# Patient Record
Sex: Female | Born: 1980
Health system: Southern US, Community
[De-identification: ages and names within clinical notes are randomized; demographics above are authoritative.]

## PROBLEM LIST (undated history)

## (undated) ENCOUNTER — Inpatient Hospital Stay (HOSPITAL_COMMUNITY): Payer: Federal, State, Local not specified - PPO

## (undated) DIAGNOSIS — R87629 Unspecified abnormal cytological findings in specimens from vagina: Secondary | ICD-10-CM

## (undated) DIAGNOSIS — O24419 Gestational diabetes mellitus in pregnancy, unspecified control: Secondary | ICD-10-CM

## (undated) HISTORY — PX: WISDOM TOOTH EXTRACTION: SHX21

## (undated) HISTORY — PX: CERVICAL BIOPSY  W/ LOOP ELECTRODE EXCISION: SUR135

---

## 2011-12-31 LAB — OB RESULTS CONSOLE GC/CHLAMYDIA
Chlamydia: NEGATIVE
Gonorrhea: NEGATIVE

## 2012-03-15 LAB — OB RESULTS CONSOLE RUBELLA ANTIBODY, IGM: Rubella: IMMUNE

## 2012-03-15 LAB — OB RESULTS CONSOLE ABO/RH: RH Type: POSITIVE

## 2012-03-15 LAB — OB RESULTS CONSOLE ANTIBODY SCREEN: Antibody Screen: NEGATIVE

## 2012-05-26 ENCOUNTER — Encounter: Payer: 59 | Attending: Obstetrics and Gynecology | Admitting: *Deleted

## 2012-05-26 ENCOUNTER — Encounter: Payer: Self-pay | Admitting: *Deleted

## 2012-05-26 DIAGNOSIS — Z713 Dietary counseling and surveillance: Secondary | ICD-10-CM | POA: Insufficient documentation

## 2012-05-26 DIAGNOSIS — O99810 Abnormal glucose complicating pregnancy: Secondary | ICD-10-CM | POA: Insufficient documentation

## 2012-05-26 NOTE — Progress Notes (Signed)
  Patient was seen on 05/26/2012 for Gestational Diabetes self-management class at the [facility]. The following learning objectives were met by the patient during this course:   States the definition of Gestational Diabetes  States why dietary management is important in controlling blood glucose  Describes the effects each nutrient has on blood glucose levels  Demonstrates ability to create a balanced meal plan  Demonstrates carbohydrate counting   States when to check blood glucose levels  Demonstrates proper blood glucose monitoring techniques  States the effect of stress and exercise on blood glucose levels  States the importance of limiting caffeine and abstaining from alcohol and smoking  Blood glucose monitor given: Blood glucose monitor given: [employer] EZ Meter Kit Lot # [id number] Exp: 11/2013 Blood glucose reading: 161 mg/dl *Pt states she has MD appointment this afternoon, so I do not plan to call office with this result.  Patient instructed to monitor glucose levels: FBS: 60 - <90 2 hour: <120  *Patient received handouts:  Nutrition Diabetes and Pregnancy  Carbohydrate Counting List  Patient will be seen for follow-up as needed.

## 2012-05-26 NOTE — Patient Instructions (Signed)
Goals:  Check glucose levels per MD as instructed  Follow Gestational Diabetes Diet as instructed  Call for follow-up as needed    

## 2012-07-21 ENCOUNTER — Other Ambulatory Visit: Payer: Self-pay | Admitting: Obstetrics and Gynecology

## 2012-07-25 ENCOUNTER — Inpatient Hospital Stay (HOSPITAL_COMMUNITY)
Admission: AD | Admit: 2012-07-25 | Discharge: 2012-07-29 | DRG: 765 | Disposition: A | Discharge status: Home / Self Care | Payer: 59 | Source: Ambulatory Visit | Attending: Obstetrics and Gynecology | Admitting: Obstetrics and Gynecology

## 2012-07-25 ENCOUNTER — Encounter (HOSPITAL_COMMUNITY): Payer: Self-pay | Admitting: *Deleted

## 2012-07-25 ENCOUNTER — Encounter (HOSPITAL_COMMUNITY): Payer: Self-pay | Admitting: Anesthesiology

## 2012-07-25 DIAGNOSIS — O9902 Anemia complicating childbirth: Secondary | ICD-10-CM | POA: Diagnosis not present

## 2012-07-25 DIAGNOSIS — D62 Acute posthemorrhagic anemia: Secondary | ICD-10-CM | POA: Diagnosis not present

## 2012-07-25 DIAGNOSIS — O323XX0 Maternal care for face, brow and chin presentation, not applicable or unspecified: Principal | ICD-10-CM | POA: Diagnosis present

## 2012-07-25 DIAGNOSIS — O99892 Other specified diseases and conditions complicating childbirth: Secondary | ICD-10-CM | POA: Diagnosis present

## 2012-07-25 DIAGNOSIS — Z22330 Carrier of Group B streptococcus: Secondary | ICD-10-CM

## 2012-07-25 DIAGNOSIS — O9903 Anemia complicating the puerperium: Secondary | ICD-10-CM | POA: Diagnosis not present

## 2012-07-25 DIAGNOSIS — O99814 Abnormal glucose complicating childbirth: Secondary | ICD-10-CM | POA: Diagnosis present

## 2012-07-25 HISTORY — DX: Gestational diabetes mellitus in pregnancy, unspecified control: O24.419

## 2012-07-25 LAB — CBC
Hemoglobin: 12.7 g/dL (ref 12.0–15.0)
MCH: 29.4 pg (ref 26.0–34.0)
MCHC: 34.2 g/dL (ref 30.0–36.0)
MCV: 85.9 fL (ref 78.0–100.0)

## 2012-07-25 LAB — RPR: RPR Ser Ql: NONREACTIVE

## 2012-07-25 MED ORDER — OXYTOCIN 40 UNITS IN LACTATED RINGERS INFUSION - SIMPLE MED
1.0000 m[IU]/min | INTRAVENOUS | Status: DC
  Administered 2012-07-25: 2 m[IU]/min via INTRAVENOUS
  Filled 2012-07-25: qty 1000

## 2012-07-25 MED ORDER — EPHEDRINE 5 MG/ML INJ
10.0000 mg | INTRAVENOUS | Status: DC | PRN
  Filled 2012-07-25: qty 4

## 2012-07-25 MED ORDER — MISOPROSTOL 25 MCG QUARTER TABLET: 25.0000 ug | ORAL_TABLET | ORAL | Status: DC | PRN

## 2012-07-25 MED ORDER — TERBUTALINE SULFATE 1 MG/ML IJ SOLN: 0.2500 mg | Freq: Once | INTRAMUSCULAR | Status: AC | PRN

## 2012-07-25 MED ORDER — LACTATED RINGERS IV SOLN: 500.0000 mL | INTRAVENOUS | Status: DC | PRN

## 2012-07-25 MED ORDER — ACETAMINOPHEN 325 MG PO TABS: 650.0000 mg | ORAL_TABLET | ORAL | Status: DC | PRN

## 2012-07-25 MED ORDER — LACTATED RINGERS IV SOLN
500.0000 mL | Freq: Once | INTRAVENOUS | Status: AC
  Administered 2012-07-25: 500 mL via INTRAVENOUS

## 2012-07-25 MED ORDER — CLINDAMYCIN PHOSPHATE 900 MG/50ML IV SOLN
900.0000 mg | Freq: Three times a day (TID) | INTRAVENOUS | Status: DC
  Administered 2012-07-25 (×2): 900 mg via INTRAVENOUS
  Filled 2012-07-25 (×4): qty 50

## 2012-07-25 MED ORDER — NALBUPHINE SYRINGE 5 MG/0.5 ML
5.0000 mg | INJECTION | INTRAMUSCULAR | Status: DC | PRN
  Administered 2012-07-25: 5 mg via INTRAVENOUS
  Filled 2012-07-25: qty 0.5

## 2012-07-25 MED ORDER — LIDOCAINE HCL (PF) 1 % IJ SOLN
INTRAMUSCULAR | Status: DC | PRN
  Administered 2012-07-25: 7 mL
  Administered 2012-07-25: 9 mL

## 2012-07-25 MED ORDER — OXYTOCIN 40 UNITS IN LACTATED RINGERS INFUSION - SIMPLE MED: 62.5000 mL/h | INTRAVENOUS | Status: DC

## 2012-07-25 MED ORDER — CLINDAMYCIN PHOSPHATE 900 MG/50ML IV SOLN: 900.0000 mg | Freq: Three times a day (TID) | INTRAVENOUS | Status: DC

## 2012-07-25 MED ORDER — FENTANYL 2.5 MCG/ML BUPIVACAINE 1/10 % EPIDURAL INFUSION (WH - ANES)
14.0000 mL/h | INTRAMUSCULAR | Status: DC
  Filled 2012-07-25: qty 125

## 2012-07-25 MED ORDER — CITRIC ACID-SODIUM CITRATE 334-500 MG/5ML PO SOLN
30.0000 mL | ORAL | Status: DC | PRN
  Administered 2012-07-26: 30 mL via ORAL
  Filled 2012-07-25 (×2): qty 15

## 2012-07-25 MED ORDER — LACTATED RINGERS IV SOLN
INTRAVENOUS | Status: DC
  Administered 2012-07-25: 16:00:00 via INTRAVENOUS

## 2012-07-25 MED ORDER — LIDOCAINE HCL (PF) 1 % IJ SOLN: 30.0000 mL | INTRAMUSCULAR | Status: DC | PRN

## 2012-07-25 MED ORDER — DIPHENHYDRAMINE HCL 50 MG/ML IJ SOLN: 12.5000 mg | INTRAMUSCULAR | Status: DC | PRN

## 2012-07-25 MED ORDER — ONDANSETRON HCL 4 MG/2ML IJ SOLN
4.0000 mg | Freq: Four times a day (QID) | INTRAMUSCULAR | Status: DC | PRN
  Administered 2012-07-25: 4 mg via INTRAVENOUS
  Filled 2012-07-25: qty 2

## 2012-07-25 MED ORDER — IBUPROFEN 600 MG PO TABS: 600.0000 mg | ORAL_TABLET | Freq: Four times a day (QID) | ORAL | Status: DC | PRN

## 2012-07-25 MED ORDER — PHENYLEPHRINE 40 MCG/ML (10ML) SYRINGE FOR IV PUSH (FOR BLOOD PRESSURE SUPPORT)
80.0000 ug | PREFILLED_SYRINGE | INTRAVENOUS | Status: DC | PRN
  Filled 2012-07-25: qty 5

## 2012-07-25 MED ORDER — ZOLPIDEM TARTRATE 5 MG PO TABS: 5.0000 mg | ORAL_TABLET | Freq: Every evening | ORAL | Status: DC | PRN

## 2012-07-25 MED ORDER — FENTANYL 2.5 MCG/ML BUPIVACAINE 1/10 % EPIDURAL INFUSION (WH - ANES)
INTRAMUSCULAR | Status: DC | PRN
  Administered 2012-07-25: 14 mL/h via EPIDURAL

## 2012-07-25 MED ORDER — EPHEDRINE 5 MG/ML INJ: 10.0000 mg | INTRAVENOUS | Status: DC | PRN

## 2012-07-25 MED ORDER — OXYTOCIN BOLUS FROM INFUSION: 500.0000 mL | INTRAVENOUS | Status: DC

## 2012-07-25 MED ORDER — PHENYLEPHRINE 40 MCG/ML (10ML) SYRINGE FOR IV PUSH (FOR BLOOD PRESSURE SUPPORT): 80.0000 ug | PREFILLED_SYRINGE | INTRAVENOUS | Status: DC | PRN

## 2012-07-25 MED ORDER — OXYCODONE-ACETAMINOPHEN 5-325 MG PO TABS: 1.0000 | ORAL_TABLET | ORAL | Status: DC | PRN

## 2012-07-25 MED ORDER — FLEET ENEMA 7-19 GM/118ML RE ENEM: 1.0000 | ENEMA | RECTAL | Status: DC | PRN

## 2012-07-25 NOTE — MAU Note (Signed)
SVE in office last week, 1 cm.

## 2012-07-25 NOTE — Plan of Care (Signed)
Problem: Consults Goal: Birthing Suites Patient Information Press F2 to bring up selections list   Pt 37-[redacted] weeks EGA     

## 2012-07-25 NOTE — H&P (Signed)
[name] [name] is a 31 y.o. female presenting @ 29 6/7 weeks in labor. PNC complicated by Class A1 GDM diet controlled and (+) GBS. Intact membrane History OB History    Grav Para Term Preterm Abortions TAB SAB Ect Mult Living   1 0 0 0 0 0 0 0 0 0      Past Medical History  Diagnosis Date  . Gestational diabetes    Past Surgical History  Procedure Date  . Cervical biopsy  w/ loop electrode excision   . Wisdom tooth extraction    Family History: family history is not on file. Social History:  reports that she has never smoked. She does not have any smokeless tobacco history on file. She reports that she does not drink alcohol or use illicit drugs.   Prenatal Transfer Tool  Maternal Diabetes: Yes:  Diabetes Type:  Diet controlled Genetic Screening: Normal Maternal Ultrasounds/Referrals: Normal Fetal Ultrasounds or other Referrals:  None Maternal Substance Abuse:  No Significant Maternal Medications:  None Significant Maternal Lab Results:  Lab values include: Group B Strep positive Other Comments:  None  ROS neg  Dilation: 2 Effacement (%): 100 Station: -1 Exam by:: Dorrene German RN Blood pressure 128/76, pulse 63, temperature 97 F (36.1 C), temperature source Axillary, resp. rate 16, height 5\' 5"  (1.651 m), weight 85.73 kg (189 lb). Maternal Exam:  Uterine Assessment: Contraction strength is mild.  Abdomen: Patient reports no abdominal tenderness. Estimated fetal weight is  7  1/2 lb.   Fetal presentation: vertex  Introitus: Normal vulva. Ferning test: not done.  Nitrazine test: not done.  Pelvis: adequate for delivery.     VE 2/100/-3 palp membranes cervix deviated to left Physical Exam  Constitutional: She is oriented to person, place, and time. She appears well-developed and well-nourished.  HENT:  Head: Atraumatic.  Neck: Neck supple.  Respiratory: Effort normal.  Musculoskeletal: She exhibits no edema.  Neurological: She is alert and oriented to person, place, and  time.  Skin: Skin is warm and dry.    Prenatal labs: ABO, Rh: B/Positive/-- (07/22 0000) Antibody: Negative (07/22 0000) Rubella: Immune (07/22 0000) RPR: Nonreactive (07/22 0000)  HBsAg: Negative (07/22 0000)  HIV: Non-reactive (07/22 0000)  GBS: Positive (11/12 0000)   Assessment/Plan: Early labor Class A1 GDM Term gestation GBS cx (+) P) admit. Left exaggerated sims position. Routine labs. BS q 4 hrs. Clindamycin IV q 8 hrs pitocin analgesic prn   [name] [name] A 07/25/2012, 4:24 PM

## 2012-07-25 NOTE — Progress Notes (Signed)
S: breathing with ctx  O: pitocin 2 MIU VE 4-5/90/-3 hyperextended/early face presentation left mentum anterior  Arom light meconium IUPC placed  Tracing: baseline 125 (+) accel Ctx q 2-3 mins BS =71 IMP: Class A1 GDm diet controlled GBS cx (+) on Clindamycin Term Active phase w/ potential face presentation P) exaggerated right sims. Pt informed may not be deliverable vaginally. Recommend epidural . Will try to do maternal position to rotate baby

## 2012-07-25 NOTE — MAU Note (Signed)
uc's started during the night, became more frequent this morning, bloody show, denies LOF.

## 2012-07-25 NOTE — Anesthesia Preprocedure Evaluation (Addendum)
Anesthesia Evaluation  Patient identified by MRN, date of birth, ID band Patient awake    Reviewed: Allergy & Precautions, H&P , NPO status , Patient's Chart, lab work & pertinent test results  Airway Mallampati: II TM Distance: >3 FB Neck ROM: full    Dental No notable dental hx.    Pulmonary neg pulmonary ROS,    Pulmonary exam normal       Cardiovascular negative cardio ROS      Neuro/Psych negative neurological ROS  negative psych ROS   GI/Hepatic negative GI ROS, Neg liver ROS,   Endo/Other  diabetes, Gestational  Renal/GU negative Renal ROS  negative genitourinary   Musculoskeletal negative musculoskeletal ROS (+)   Abdominal Normal abdominal exam  (+)   Peds negative pediatric ROS (+)  Hematology negative hematology ROS (+)   Anesthesia Other Findings   Reproductive/Obstetrics (+) Pregnancy                           Anesthesia Physical Anesthesia Plan  ASA: II  Anesthesia Plan: Epidural   Post-op Pain Management:    Induction:   Airway Management Planned:   Additional Equipment:   Intra-op Plan:   Post-operative Plan:   Informed Consent: I have reviewed the patients History and Physical, chart, labs and discussed the procedure including the risks, benefits and alternatives for the proposed anesthesia with the patient or authorized representative who has indicated his/her understanding and acceptance.     Plan Discussed with:   Anesthesia Plan Comments:         Anesthesia Quick Evaluation

## 2012-07-25 NOTE — Anesthesia Procedure Notes (Signed)
Epidural Patient location during procedure: OB Start time: 07/25/2012 9:59 PM End time: 07/25/2012 10:05 PM  Staffing Anesthesiologist: Sandrea Hughs  Preanesthetic Checklist Completed: patient identified, site marked, surgical consent, pre-op evaluation, timeout performed, IV checked, risks and benefits discussed and monitors and equipment checked  Epidural Patient position: sitting Prep: site prepped and draped and DuraPrep Patient monitoring: continuous pulse ox and blood pressure Approach: midline Injection technique: LOR air  Needle:  Needle type: Tuohy  Needle gauge: 17 G Needle length: 9 cm and 9 Needle insertion depth: 6 cm Catheter type: closed end flexible Catheter size: 19 Gauge Catheter at skin depth: 11 cm Test dose: negative and Other  Assessment Sensory level: T9 Events: blood not aspirated, injection not painful, no injection resistance, negative IV test and no paresthesia  Additional Notes Reason for block:procedure for pain

## 2012-07-26 ENCOUNTER — Inpatient Hospital Stay (HOSPITAL_COMMUNITY): Payer: 59 | Admitting: Anesthesiology

## 2012-07-26 ENCOUNTER — Encounter (HOSPITAL_COMMUNITY)
Admission: AD | Disposition: A | Discharge status: Home / Self Care | Payer: Self-pay | Source: Ambulatory Visit | Attending: Obstetrics and Gynecology

## 2012-07-26 ENCOUNTER — Encounter (HOSPITAL_COMMUNITY): Payer: Self-pay | Admitting: Anesthesiology

## 2012-07-26 ENCOUNTER — Encounter (HOSPITAL_COMMUNITY): Payer: Self-pay | Admitting: *Deleted

## 2012-07-26 LAB — GLUCOSE, CAPILLARY: Glucose-Capillary: 96 mg/dL (ref 70–99)

## 2012-07-26 SURGERY — Surgical Case
Anesthesia: Epidural | Site: Abdomen | Wound class: Clean Contaminated

## 2012-07-26 MED ORDER — OXYTOCIN 10 UNIT/ML IJ SOLN
INTRAMUSCULAR | Status: AC
  Filled 2012-07-26: qty 4

## 2012-07-26 MED ORDER — SENNOSIDES-DOCUSATE SODIUM 8.6-50 MG PO TABS
2.0000 | ORAL_TABLET | Freq: Every day | ORAL | Status: DC
  Administered 2012-07-26 – 2012-07-28 (×3): 2 via ORAL

## 2012-07-26 MED ORDER — LANOLIN HYDROUS EX OINT: 1.0000 "application " | TOPICAL_OINTMENT | CUTANEOUS | Status: DC | PRN

## 2012-07-26 MED ORDER — WITCH HAZEL-GLYCERIN EX PADS: 1.0000 "application " | MEDICATED_PAD | CUTANEOUS | Status: DC | PRN

## 2012-07-26 MED ORDER — NALOXONE HCL 1 MG/ML IJ SOLN
1.0000 ug/kg/h | INTRAVENOUS | Status: DC | PRN
  Filled 2012-07-26: qty 2

## 2012-07-26 MED ORDER — CEFAZOLIN SODIUM-DEXTROSE 2-3 GM-% IV SOLR
INTRAVENOUS | Status: AC
  Filled 2012-07-26: qty 50

## 2012-07-26 MED ORDER — KETOROLAC TROMETHAMINE 30 MG/ML IJ SOLN: 30.0000 mg | Freq: Four times a day (QID) | INTRAMUSCULAR | Status: AC | PRN

## 2012-07-26 MED ORDER — LACTATED RINGERS IV SOLN
INTRAVENOUS | Status: DC
  Administered 2012-07-26: 08:00:00 via INTRAVENOUS

## 2012-07-26 MED ORDER — ONDANSETRON HCL 4 MG/2ML IJ SOLN: 4.0000 mg | Freq: Three times a day (TID) | INTRAMUSCULAR | Status: DC | PRN

## 2012-07-26 MED ORDER — 0.9 % SODIUM CHLORIDE (POUR BTL) OPTIME
TOPICAL | Status: DC | PRN
  Administered 2012-07-26: 1000 mL

## 2012-07-26 MED ORDER — MEPERIDINE HCL 25 MG/ML IJ SOLN: 6.2500 mg | INTRAMUSCULAR | Status: DC | PRN

## 2012-07-26 MED ORDER — ONDANSETRON HCL 4 MG/2ML IJ SOLN: 4.0000 mg | INTRAMUSCULAR | Status: DC | PRN

## 2012-07-26 MED ORDER — FENTANYL CITRATE 0.05 MG/ML IJ SOLN
INTRAMUSCULAR | Status: DC | PRN
  Administered 2012-07-26 (×2): 50 ug via INTRAVENOUS

## 2012-07-26 MED ORDER — OXYTOCIN 40 UNITS IN LACTATED RINGERS INFUSION - SIMPLE MED: 62.5000 mL/h | INTRAVENOUS | Status: AC

## 2012-07-26 MED ORDER — HYDROMORPHONE HCL PF 1 MG/ML IJ SOLN: 0.2500 mg | INTRAMUSCULAR | Status: DC | PRN

## 2012-07-26 MED ORDER — SODIUM BICARBONATE 8.4 % IV SOLN
INTRAVENOUS | Status: DC | PRN
  Administered 2012-07-26: 10 mL via EPIDURAL

## 2012-07-26 MED ORDER — KETOROLAC TROMETHAMINE 60 MG/2ML IM SOLN
60.0000 mg | Freq: Once | INTRAMUSCULAR | Status: AC | PRN
  Administered 2012-07-26: 60 mg via INTRAMUSCULAR

## 2012-07-26 MED ORDER — EPHEDRINE 5 MG/ML INJ
INTRAVENOUS | Status: AC
  Filled 2012-07-26: qty 10

## 2012-07-26 MED ORDER — ONDANSETRON HCL 4 MG PO TABS: 4.0000 mg | ORAL_TABLET | ORAL | Status: DC | PRN

## 2012-07-26 MED ORDER — SIMETHICONE 80 MG PO CHEW: 80.0000 mg | CHEWABLE_TABLET | ORAL | Status: DC | PRN

## 2012-07-26 MED ORDER — NALBUPHINE SYRINGE 5 MG/0.5 ML
5.0000 mg | INJECTION | INTRAMUSCULAR | Status: DC | PRN
  Filled 2012-07-26: qty 1

## 2012-07-26 MED ORDER — MORPHINE SULFATE (PF) 0.5 MG/ML IJ SOLN
INTRAMUSCULAR | Status: DC | PRN
  Administered 2012-07-26: 1 mg via INTRAVENOUS

## 2012-07-26 MED ORDER — OXYTOCIN 10 UNIT/ML IJ SOLN
40.0000 [IU] | INTRAVENOUS | Status: DC | PRN
  Administered 2012-07-26: 40 [IU] via INTRAVENOUS

## 2012-07-26 MED ORDER — DIPHENHYDRAMINE HCL 25 MG PO CAPS: 25.0000 mg | ORAL_CAPSULE | ORAL | Status: DC | PRN

## 2012-07-26 MED ORDER — DIPHENHYDRAMINE HCL 50 MG/ML IJ SOLN: 12.5000 mg | INTRAMUSCULAR | Status: DC | PRN

## 2012-07-26 MED ORDER — BUPIVACAINE HCL (PF) 0.25 % IJ SOLN
INTRAMUSCULAR | Status: DC | PRN
  Administered 2012-07-26: 6 mL

## 2012-07-26 MED ORDER — PHENYLEPHRINE HCL 10 MG/ML IJ SOLN
INTRAMUSCULAR | Status: DC | PRN
  Administered 2012-07-26: 80 ug via INTRAVENOUS
  Administered 2012-07-26 (×2): 120 ug via INTRAVENOUS
  Administered 2012-07-26 (×2): 80 ug via INTRAVENOUS
  Administered 2012-07-26: 120 ug via INTRAVENOUS

## 2012-07-26 MED ORDER — OXYCODONE-ACETAMINOPHEN 5-325 MG PO TABS
1.0000 | ORAL_TABLET | ORAL | Status: DC | PRN
  Administered 2012-07-27 – 2012-07-29 (×9): 1 via ORAL
  Filled 2012-07-26 (×9): qty 1

## 2012-07-26 MED ORDER — SODIUM CHLORIDE 0.9 % IJ SOLN: 3.0000 mL | INTRAMUSCULAR | Status: DC | PRN

## 2012-07-26 MED ORDER — FENTANYL CITRATE 0.05 MG/ML IJ SOLN
INTRAMUSCULAR | Status: AC
  Filled 2012-07-26: qty 2

## 2012-07-26 MED ORDER — PHENYLEPHRINE 40 MCG/ML (10ML) SYRINGE FOR IV PUSH (FOR BLOOD PRESSURE SUPPORT)
PREFILLED_SYRINGE | INTRAVENOUS | Status: AC
  Filled 2012-07-26: qty 10

## 2012-07-26 MED ORDER — KETOROLAC TROMETHAMINE 60 MG/2ML IM SOLN
INTRAMUSCULAR | Status: AC
  Administered 2012-07-26: 60 mg via INTRAMUSCULAR
  Filled 2012-07-26: qty 2

## 2012-07-26 MED ORDER — ONDANSETRON HCL 4 MG/2ML IJ SOLN
INTRAMUSCULAR | Status: DC | PRN
  Administered 2012-07-26: 4 mg via INTRAVENOUS

## 2012-07-26 MED ORDER — GENTAMICIN SULFATE 40 MG/ML IJ SOLN
128.5500 mg | INTRAVENOUS | Status: DC | PRN
  Administered 2012-07-26: 350 mg via INTRAVENOUS

## 2012-07-26 MED ORDER — KETOROLAC TROMETHAMINE 30 MG/ML IJ SOLN: 15.0000 mg | Freq: Once | INTRAMUSCULAR | Status: DC | PRN

## 2012-07-26 MED ORDER — DEXTROSE 5 % IV SOLN
350.0000 mg | Freq: Once | INTRAVENOUS | Status: DC
  Filled 2012-07-26: qty 8.75

## 2012-07-26 MED ORDER — NALOXONE HCL 0.4 MG/ML IJ SOLN: 0.4000 mg | INTRAMUSCULAR | Status: DC | PRN

## 2012-07-26 MED ORDER — SIMETHICONE 80 MG PO CHEW
80.0000 mg | CHEWABLE_TABLET | Freq: Three times a day (TID) | ORAL | Status: DC
  Administered 2012-07-26 – 2012-07-29 (×10): 80 mg via ORAL

## 2012-07-26 MED ORDER — DIPHENHYDRAMINE HCL 50 MG/ML IJ SOLN: 25.0000 mg | INTRAMUSCULAR | Status: DC | PRN

## 2012-07-26 MED ORDER — PRENATAL MULTIVITAMIN CH
1.0000 | ORAL_TABLET | Freq: Every day | ORAL | Status: DC
  Administered 2012-07-27 – 2012-07-29 (×3): 1 via ORAL
  Filled 2012-07-26 (×3): qty 1

## 2012-07-26 MED ORDER — LACTATED RINGERS IV SOLN
INTRAVENOUS | Status: DC | PRN
  Administered 2012-07-26 (×2): via INTRAVENOUS

## 2012-07-26 MED ORDER — MORPHINE SULFATE (PF) 0.5 MG/ML IJ SOLN
INTRAMUSCULAR | Status: DC | PRN
  Administered 2012-07-26: 4 mg via EPIDURAL

## 2012-07-26 MED ORDER — DIBUCAINE 1 % RE OINT: 1.0000 "application " | TOPICAL_OINTMENT | RECTAL | Status: DC | PRN

## 2012-07-26 MED ORDER — BISACODYL 10 MG RE SUPP: 10.0000 mg | Freq: Every day | RECTAL | Status: DC | PRN

## 2012-07-26 MED ORDER — IBUPROFEN 600 MG PO TABS
600.0000 mg | ORAL_TABLET | Freq: Four times a day (QID) | ORAL | Status: DC
  Administered 2012-07-26 – 2012-07-29 (×11): 600 mg via ORAL
  Filled 2012-07-26 (×12): qty 1

## 2012-07-26 MED ORDER — METHYLERGONOVINE MALEATE 0.2 MG/ML IJ SOLN: 0.2000 mg | INTRAMUSCULAR | Status: DC | PRN

## 2012-07-26 MED ORDER — FLEET ENEMA 7-19 GM/118ML RE ENEM: 1.0000 | ENEMA | Freq: Every day | RECTAL | Status: DC | PRN

## 2012-07-26 MED ORDER — PHENYLEPHRINE 40 MCG/ML (10ML) SYRINGE FOR IV PUSH (FOR BLOOD PRESSURE SUPPORT)
PREFILLED_SYRINGE | INTRAVENOUS | Status: AC
  Filled 2012-07-26: qty 5

## 2012-07-26 MED ORDER — KETOROLAC TROMETHAMINE 30 MG/ML IJ SOLN
30.0000 mg | Freq: Four times a day (QID) | INTRAMUSCULAR | Status: AC | PRN
  Administered 2012-07-26: 30 mg via INTRAVENOUS
  Filled 2012-07-26: qty 1

## 2012-07-26 MED ORDER — EPHEDRINE SULFATE 50 MG/ML IJ SOLN
INTRAMUSCULAR | Status: DC | PRN
  Administered 2012-07-26 (×4): 5 mg via INTRAVENOUS

## 2012-07-26 MED ORDER — SCOPOLAMINE 1 MG/3DAYS TD PT72
MEDICATED_PATCH | TRANSDERMAL | Status: AC
  Administered 2012-07-26: 1.5 mg via TRANSDERMAL
  Filled 2012-07-26: qty 1

## 2012-07-26 MED ORDER — METHYLERGONOVINE MALEATE 0.2 MG PO TABS: 0.2000 mg | ORAL_TABLET | ORAL | Status: DC | PRN

## 2012-07-26 MED ORDER — BUPIVACAINE HCL (PF) 0.25 % IJ SOLN
INTRAMUSCULAR | Status: AC
  Filled 2012-07-26: qty 30

## 2012-07-26 MED ORDER — FERROUS SULFATE 325 (65 FE) MG PO TABS
325.0000 mg | ORAL_TABLET | Freq: Two times a day (BID) | ORAL | Status: DC
  Administered 2012-07-26 – 2012-07-27 (×2): 325 mg via ORAL
  Filled 2012-07-26 (×2): qty 1

## 2012-07-26 MED ORDER — ONDANSETRON HCL 4 MG/2ML IJ SOLN
INTRAMUSCULAR | Status: AC
  Filled 2012-07-26: qty 2

## 2012-07-26 MED ORDER — SCOPOLAMINE 1 MG/3DAYS TD PT72
1.0000 | MEDICATED_PATCH | Freq: Once | TRANSDERMAL | Status: DC
  Administered 2012-07-26: 1.5 mg via TRANSDERMAL

## 2012-07-26 MED ORDER — PROMETHAZINE HCL 25 MG/ML IJ SOLN: 6.2500 mg | INTRAMUSCULAR | Status: DC | PRN

## 2012-07-26 MED ORDER — DIPHENHYDRAMINE HCL 25 MG PO CAPS: 25.0000 mg | ORAL_CAPSULE | Freq: Four times a day (QID) | ORAL | Status: DC | PRN

## 2012-07-26 MED ORDER — SODIUM CHLORIDE 0.9 % IJ SOLN: 3.0000 mL | Freq: Two times a day (BID) | INTRAMUSCULAR | Status: DC

## 2012-07-26 MED ORDER — MENTHOL 3 MG MT LOZG: 1.0000 | LOZENGE | OROMUCOSAL | Status: DC | PRN

## 2012-07-26 MED ORDER — METOCLOPRAMIDE HCL 5 MG/ML IJ SOLN: 10.0000 mg | Freq: Three times a day (TID) | INTRAMUSCULAR | Status: DC | PRN

## 2012-07-26 MED ORDER — MORPHINE SULFATE 0.5 MG/ML IJ SOLN
INTRAMUSCULAR | Status: AC
  Filled 2012-07-26: qty 10

## 2012-07-26 MED ORDER — SODIUM CHLORIDE 0.9 % IV SOLN: 250.0000 mL | INTRAVENOUS | Status: DC

## 2012-07-26 MED ORDER — ZOLPIDEM TARTRATE 5 MG PO TABS: 5.0000 mg | ORAL_TABLET | Freq: Every evening | ORAL | Status: DC | PRN

## 2012-07-26 SURGICAL SUPPLY — 46 items
BARRIER ADHS 3X4 INTERCEED (GAUZE/BANDAGES/DRESSINGS) IMPLANT
BENZOIN TINCTURE PRP APPL 2/3 (GAUZE/BANDAGES/DRESSINGS) IMPLANT
CLOTH BEACON ORANGE TIMEOUT ST (SAFETY) ×2 IMPLANT
CONTAINER PREFILL 10% NBF 15ML (MISCELLANEOUS) IMPLANT
DRESSING TELFA 8X3 (GAUZE/BANDAGES/DRESSINGS) IMPLANT
DRSG COVADERM 4X10 (GAUZE/BANDAGES/DRESSINGS) ×4 IMPLANT
DURAPREP 26ML APPLICATOR (WOUND CARE) ×2 IMPLANT
ELECT REM PT RETURN 9FT ADLT (ELECTROSURGICAL) ×2
ELECTRODE REM PT RTRN 9FT ADLT (ELECTROSURGICAL) ×1 IMPLANT
EXTRACTOR VACUUM KIWI (MISCELLANEOUS) IMPLANT
EXTRACTOR VACUUM M CUP 4 TUBE (SUCTIONS) IMPLANT
GAUZE SPONGE 4X4 12PLY STRL LF (GAUZE/BANDAGES/DRESSINGS) ×4 IMPLANT
GLOVE BIO SURGEON STRL SZ 6.5 (GLOVE) IMPLANT
GLOVE BIOGEL PI IND STRL 7.0 (GLOVE) ×2 IMPLANT
GLOVE BIOGEL PI INDICATOR 7.0 (GLOVE) ×2
GLOVE SKINSENSE NS SZ6.5 (GLOVE) ×1
GLOVE SKINSENSE NS SZ7.0 (GLOVE) ×1
GLOVE SKINSENSE STRL SZ6.5 (GLOVE) ×1 IMPLANT
GLOVE SKINSENSE STRL SZ7.0 (GLOVE) ×1 IMPLANT
GOWN PREVENTION PLUS LG XLONG (DISPOSABLE) ×6 IMPLANT
KIT ABG SYR 3ML LUER SLIP (SYRINGE) IMPLANT
NEEDLE HYPO 25X1 1.5 SAFETY (NEEDLE) ×2 IMPLANT
NEEDLE HYPO 25X5/8 SAFETYGLIDE (NEEDLE) IMPLANT
NS IRRIG 1000ML POUR BTL (IV SOLUTION) ×2 IMPLANT
PACK C SECTION WH (CUSTOM PROCEDURE TRAY) ×2 IMPLANT
PAD ABD 7.5X8 STRL (GAUZE/BANDAGES/DRESSINGS) ×2 IMPLANT
PAD OB MATERNITY 4.3X12.25 (PERSONAL CARE ITEMS) ×2 IMPLANT
RTRCTR C-SECT PINK 25CM LRG (MISCELLANEOUS) IMPLANT
SLEEVE SCD COMPRESS KNEE MED (MISCELLANEOUS) ×2 IMPLANT
STAPLER VISISTAT 35W (STAPLE) ×2 IMPLANT
STRIP CLOSURE SKIN 1/2X4 (GAUZE/BANDAGES/DRESSINGS) IMPLANT
SUT CHROMIC GUT AB #0 18 (SUTURE) IMPLANT
SUT MNCRL 0 VIOLET CTX 36 (SUTURE) ×3 IMPLANT
SUT MON AB 4-0 PS1 27 (SUTURE) IMPLANT
SUT MONOCRYL 0 CTX 36 (SUTURE) ×3
SUT PLAIN 2 0 (SUTURE)
SUT PLAIN 2 0 XLH (SUTURE) ×2 IMPLANT
SUT PLAIN ABS 2-0 CT1 27XMFL (SUTURE) IMPLANT
SUT VIC AB 0 CT1 27 (SUTURE) ×2
SUT VIC AB 0 CT1 27XBRD ANBCTR (SUTURE) ×2 IMPLANT
SUT VIC AB 2-0 CT1 27 (SUTURE) ×1
SUT VIC AB 2-0 CT1 TAPERPNT 27 (SUTURE) ×1 IMPLANT
SYR CONTROL 10ML LL (SYRINGE) ×2 IMPLANT
TOWEL OR 17X24 6PK STRL BLUE (TOWEL DISPOSABLE) ×4 IMPLANT
TRAY FOLEY CATH 14FR (SET/KITS/TRAYS/PACK) IMPLANT
WATER STERILE IRR 1000ML POUR (IV SOLUTION) IMPLANT

## 2012-07-26 NOTE — Op Note (Signed)
NAME:  [name] [name], [name] [name]                ACCOUNT NO.:  [id number]  MEDICAL RECORD NO.:  [id number]  LOCATION:                                 FACILITY:  PHYSICIAN:  Maxie Better, M.D.DATE OF BIRTH:  [birth year]  DATE OF PROCEDURE:  07/26/2012 DATE OF DISCHARGE:                              OPERATIVE REPORT   PREOPERATIVE DIAGNOSES:  Persistent face presentation, class A1 gestational diabetes, term gestation.  PROCEDURES:  A primary cesarean section, Kerr hysterotomy.  POSTOPERATIVE DIAGNOSES:  Persistent face presentation, class A1 gestational diabetes, term gestation.  ANESTHESIA:  Epidural.  SURGEON:  Maxie Better, M.D.  ASSISTANT:  None.  PROCEDURE IN DETAIL:  Under adequate epidural anesthesia, the patient was placed in the supine position with a left lateral tilt.  An indwelling Foley catheter was already in place.  The patient was sterilely prepped and draped in usual fashion.  A 0.25% Marcaine was injected along the planned Pfannenstiel skin incision site. Pfannenstiel skin incision was then made, carried down to the rectus fascia.  Rectus fascia was opened transversely.  Rectus fascia was then bluntly and sharply dissected off the rectus muscle in the superior and inferior fashion.  The rectus muscle was split in midline.  The parietal peritoneum was opened bluntly and extended.  The vesicouterine peritoneum was opened transversely.  The bladder was then bluntly dissected off the lower uterine segment and displaced inferiorly with a bladder retractor.  A curvilinear low transverse uterine incision was then made and extended with bandage scissors.  Copious light meconium amniotic fluid was then noted.  Subsequent delivery of a live female in face presentation was accomplished.  Baby was bulb suctioned.  The abdomen cord was clamped, cut.  The baby was transferred to the awaiting pediatrician who assigned Apgars of 8 and 9 at one and five minutes. The placenta  was spontaneous intact.  Short cord noted but the placenta itself was not sent to pathology.  Uterine cavity was cleaned of debris. Uterine incision had no extension and was closed in two layers, the first layer with 0 Monocryl running locked stitch, second layer was imbricated using 0 Monocryl suture.  Good hemostasis was noted along the incision line.  Normal tubes and ovaries were noted bilaterally.  A small hematoma was noted on the left broad ligament area which was not expanding and therefore left alone.  The abdomen was then copiously irrigated and suctioned of debris.  The parietal peritoneum was then closed with 2-0 Vicryl.  The rectus fascia was closed with 0 Vicryl x2. The subcutaneous areas irrigated, small bleeders cauterized. Interrupted 2-0 plain sutures placed and the skin approximated using Ethicon staples.  SPECIMENS:  Placenta not sent to pathology.  ESTIMATED BLOOD LOSS:  700 mL.  INTRAOPERATIVE FLUID:  1700 mL.  URINE OUTPUT:  100 mL clear yellow urine.  COUNTS:  Sponge and instrument counts x2 was correct.  COMPLICATION:  None.  The patient tolerated the procedure well, was transferred to recovery in stable condition.     Maxie Better, M.D.     [place]/MEDQ  D:  07/26/2012  T:  07/26/2012  Job:  [id number]

## 2012-07-26 NOTE — Progress Notes (Signed)
POSTOPERATIVE DAY 0 - interval note (4-5 hourrs postop)   S:         Sleeping - family asked not to disturb - just fell asleep in past 1/2 hour              Reports some nausea - tolerating fluids ok              No complaints to family about pain - just exhausted  O:  VS: BP 109/70  Pulse 64  Temp 97.8 F (36.6 C) (Oral)  Resp 20  Ht 5\' 5"  (1.651 m)  Wt 85.73 kg (189 lb)  BMI 31.45 kg/m2  SpO2 98%  Breastfeeding? Unknown              Lochia reported by nursing light                        I&O:  -550  A:        POD #0 interval visit            P:        Routine postoperative care - rounding tomorrow as scheduled       Marlinda Mike CNM, MSN 07/26/2012, 8:50 AM

## 2012-07-26 NOTE — Transfer of Care (Signed)
Immediate Anesthesia Transfer of Care Note  Patient: [name] [name]  Procedure(s) Performed: Procedure(s) (LRB) with comments: CESAREAN SECTION (N/A) - Primary Cesarean Section Delivery Baby Girl @  [phone], Apgars 8/9   Patient Location: PACU  Anesthesia Type:Epidural  Level of Consciousness: awake, alert  and oriented  Airway & Oxygen Therapy: Patient Spontanous Breathing  Post-op Assessment: Report given to PACU RN and Post -op Vital signs reviewed and stable  Post vital signs: stable  Complications: No apparent anesthesia complications

## 2012-07-26 NOTE — Brief Op Note (Signed)
07/25/2012 - 07/26/2012  4:01 AM  PATIENT:  [name] [name]  31 y.o. female  PRE-OPERATIVE DIAGNOSIS:  Persistent face presentation( Malpresentation), CLass A1 GDM, term gestation  POST-OPERATIVE DIAGNOSIS:  same  PROCEDURE:  Procedure(s) (LRB) with comments: CESAREAN SECTION (N/A) - Primary Cesarean Section Delivery Baby Girl @  [phone], Apgars 8/9 Sharl Ma hysterotomy  SURGEON:  Surgeon(s) and Role:    * Veleda Mun A Roxanne Panek, MD - Primary  PHYSICIAN ASSISTANT:   ASSISTANTS: none   ANESTHESIA:   epidural Findings: short cord, nl tubes and ovaries, face presentation mentum right anterior, intact placenta EBL:  Total I/O In: 1700 [I.V.:1700] Out: 2300 [Urine:950; Emesis/NG output:650; Blood:700]  BLOOD ADMINISTERED:none  DRAINS: none   LOCAL MEDICATIONS USED:  MARCAINE     SPECIMEN:  No Specimen  DISPOSITION OF SPECIMEN:  N/A  COUNTS:  YES  TOURNIQUET:  * No tourniquets in log *  DICTATION: .Other Dictation: Dictation Number [id number]  PLAN OF CARE: Admit to inpatient   PATIENT DISPOSITION:  PACU - hemodynamically stable.   Delay start of Pharmacological VTE agent (>24hrs) due to surgical blood loss or risk of bleeding: no

## 2012-07-26 NOTE — Consult Note (Signed)
Neonatology Note:  Attendance at C-section:  I was asked to attend this primary C/S at term due to face presentation. The mother is a G1P0 B pos, GBS pos with diet-controlled GDM (well-controlled). ROM 6 hours prior to delivery, fluid with moderate meconium. Patient received Clindamycin > 4 hours prior to delivery and she was afebrile during labor. Infant vigorous with good spontaneous cry and tone. Needed only bulb suctioning for some thick, white mucous. Ap 8/9. Lungs clear to ausc in DR. Baby's face is quite swollen with an abrasion on the right cheek (about 3-4 mm, round) and a linear scratch on her forehead, shown to father in DR. Her head is "tomahawk" shaped due to in utero position. To CN to care of Pediatrician.  Wadsworth Skolnick, MD  

## 2012-07-26 NOTE — Anesthesia Postprocedure Evaluation (Signed)
Anesthesia Post Note  Patient: [name] [name]  Procedure(s) Performed: Procedure(s) (LRB): CESAREAN SECTION (N/A)  Anesthesia type: Epidural  Patient location: PACU  Post pain: Pain level controlled  Post assessment: Post-op Vital signs reviewed  Last Vitals:  Filed Vitals:   07/26/12 0530  BP: 111/59  Pulse: 65  Temp: 36.8 C  Resp: 19    Post vital signs: Reviewed  Level of consciousness: awake  Complications: No apparent anesthesia complications

## 2012-07-26 NOTE — Progress Notes (Signed)
S: comfortable  O: Pitocin 4 MIU VE; 8 cm / transverse  Finger to fetal mouth @ 3 oclock position but not centered Tracing: baseline 125  Moderate variability Ctx q 1 1/2 mins   IMP: Adequate labor but malpresentation not amenable to vaginal delivery Class A1 GDM Term gestation GBS cx (+) on Clindamycin  P) recommend C/S   Risk of C/S reviewed w/ pt: infection, bleeding, injury to surrounding organ structure, internal scar tissue. All ? answered

## 2012-07-26 NOTE — Anesthesia Postprocedure Evaluation (Signed)
Anesthesia Post Note  Patient: [name] [name]  Procedure(s) Performed: Procedure(s) (LRB): CESAREAN SECTION (N/A)  Anesthesia type: Epidural  Patient location: Mother/Baby  Post pain: Pain level controlled  Post assessment: Post-op Vital signs reviewed  Last Vitals:  Filed Vitals:   07/26/12 0700  BP: 109/70  Pulse: 64  Temp: 36.6 C  Resp: 20    Post vital signs: Reviewed  Level of consciousness:alert  Complications: No apparent anesthesia complications

## 2012-07-26 NOTE — Addendum Note (Signed)
Addendum  created 07/26/12 1610 by Algis Greenhouse, CRNA   Modules edited:Notes Section

## 2012-07-27 DIAGNOSIS — O9902 Anemia complicating childbirth: Secondary | ICD-10-CM | POA: Diagnosis not present

## 2012-07-27 LAB — CBC
Hemoglobin: 9.8 g/dL — ABNORMAL LOW (ref 12.0–15.0)
MCH: 28.7 pg (ref 26.0–34.0)
MCHC: 32.8 g/dL (ref 30.0–36.0)
Platelets: 140 10*3/uL — ABNORMAL LOW (ref 150–400)
RDW: 13.3 % (ref 11.5–15.5)

## 2012-07-27 MED ORDER — POLYSACCHARIDE IRON COMPLEX 150 MG PO CAPS
150.0000 mg | ORAL_CAPSULE | Freq: Every day | ORAL | Status: DC
  Administered 2012-07-27 – 2012-07-29 (×3): 150 mg via ORAL
  Filled 2012-07-27 (×3): qty 1

## 2012-07-27 NOTE — Progress Notes (Signed)
Subjective: Postpartum Day1: Cesarean Delivery, persistent face presentation.  Patient reports tolerating PO, + flatus and no problems voiding.  +BM no complaints, up ad lib without syncope Pain well controlled with po meds, using motrin and percocet  BF: Baby latching but having problems with Keeping baby on nipple - needs lactation consult today. Mood stable, bonding well.   Objective: Vital signs in last 24 hours: Temp:  [97.4 F (36.3 C)-98.8 F (37.1 C)] 98.8 F (37.1 C) (12/03 0503) Pulse Rate:  [59-91] 75  (12/03 0503) Resp:  [16-18] 18  (12/03 0503) BP: (96-145)/(43-70) 108/65 mmHg (12/03 0503) SpO2:  [96 %-98 %] 98 % (12/03 0215)  Physical Exam:  General: alert, cooperative, fatigued and no distress Breasts: N/T Heart: RRR Lungs: CTAB Abdomen: BS x4 Uterine Fundus: firm, -2/ u Incision: no significant drainage, to shower today and take down incision dressing with assistance. Lochia: appropriate DVT Evaluation: No evidence of DVT seen on physical exam. Negative Homan's sign. No cords or calf tenderness. No significant calf/ankle edema.   Basename 07/27/12 0425 07/25/12 1550  HGB 9.8* 12.7  HCT 29.9* 37.1   To commence on Niferex 150mg s po daily.  Assessment/Plan: Status post Cesarean section. Doing well postoperatively.  Continue current care. Ambulating and coping well s/p C/S   Jayin Derousse,CNM. 07/27/2012, 8:32 AM

## 2012-07-27 NOTE — Progress Notes (Signed)
UR chart review completed.  

## 2012-07-28 ENCOUNTER — Encounter (HOSPITAL_COMMUNITY): Payer: Self-pay | Admitting: Obstetrics and Gynecology

## 2012-07-28 NOTE — Progress Notes (Signed)
POSTOPERATIVE DAY # 2 S/P cesarean section   S:         Reports feeling ok - really disappointed and upset about c-section              States " I did not have delivery - I had surgery"                                   States c-section is " the worst thing in the world... It has been an awful experience"             Tolerating po intake / no nausea / no vomiting / + flatus / no  BM             Bleeding is light             Pain controlled with motrin and percocet             Up ad lib / ambulatory/ voiding QS  Newborn breast feeding  / newborn face scratched and still some edema (face presentation)   O:  VS: BP 95/57  Pulse 72  Temp 99 F (37.2 C) (Oral)  Resp 18  Ht 5\' 5"  (1.651 m)  Wt 85.73 kg (189 lb)  BMI 31.45 kg/m2  SpO2 98%  Breastfeeding? Unknown   LABS:  Basename 07/27/12 0425 07/25/12 1550  WBC 15.0* 11.6*  HGB 9.8* 12.7  PLT 140* 170                Physical Exam:             Alert and Oriented X3  Lungs: Clear and unlabored  Heart: regular rate and rhythm / no mumurs  Abdomen: soft, non-tender, non-distended, active BS             Fundus: firm, non-tender, U-1             Dressing OFF              Incision:  approximated with staples / no erythema / no ecchymosis / no drainage  Perineum: no edema  Lochia: light  Extremities: trace edema, no calf pain or tenderness, TEDs in place  A:        POD # 2 S/P cesarean section             Mild acute blood loss anemia            Suspect at risk for postpartum adjustment - associated traumatic birth  P:        Routine postoperative care   Discussed with patient - no other alternative for delivery with brow - face presentation C-section is not the worst thing in the world - the worst thing would be that something happened to her or her baby - the first goal is to ensure mom and baby safety. Assured her she had a delivery - she did all that sh could do in this situation. No reason not to try natural childbirth and  vaginal birth next pregnancy - unlikely recurrence of this type of presentation in subsequent pregnancy. Encouraged it will take time to work thru all of the emotions from her delivery and may help to talk with someone about her experience.  Will make recommendation for counselor to help deal with her feelings about her birth. Make referral at discharge. Notify primary OB  Discharge in am   [place],  Silver Huguenin, MSN 07/28/2012, 11:25 AM

## 2012-07-29 MED ORDER — IBUPROFEN 600 MG PO TABS: 600.0000 mg | ORAL_TABLET | Freq: Four times a day (QID) | ORAL | Status: DC | PRN

## 2012-07-29 MED ORDER — POLYSACCHARIDE IRON COMPLEX 150 MG PO CAPS: 150.0000 mg | ORAL_CAPSULE | Freq: Every day | ORAL | Status: DC

## 2012-07-29 MED ORDER — OXYCODONE-ACETAMINOPHEN 5-325 MG PO TABS: 1.0000 | ORAL_TABLET | ORAL | Status: DC | PRN

## 2012-07-29 NOTE — Progress Notes (Signed)
Subjective: Postpartum Day 3: Primary Cesarean Delivery as persistent face presentation Patient reports incisional pain, + flatus, + BM and no problems voiding.  +BM no complaints, up ad lib without syncope Pain well controlled with po meds, using motrin and percocet  BF": om demand latching feeding well. Mood stable, bonding well Patient still remains upset re PLTCS and plans to VBAC next time.   Objective: Vital signs in last 24 hours: Temp:  [97.8 F (36.6 C)-98.4 F (36.9 C)] 97.8 F (36.6 C) (12/05 0501) Pulse Rate:  [61-90] 61  (12/05 0501) Resp:  [18-20] 20  (12/05 0501) BP: (100-124)/(60-82) 114/71 mmHg (12/05 0501)  Physical Exam:  General: alert, cooperative and no distress Breast: N/T Heart: RRR Lungs: CTAB Abdomen: BS x4, BM x 1 Uterine Fundus: firm, -2/u Incision: healing well, no significant drainage, no dehiscence, no significant erythema, Staples removed and steri- strips placed over incision site, dry dressing placed to stop fiction of clothing. Lochia: appropriate DVT Evaluation: No evidence of DVT seen on physical exam. Negative Homan's sign. No cords or calf tenderness. No significant calf/ankle edema.   Basename 07/27/12 0425  HGB 9.8*  HCT 29.9*   Anemia of pregnancy - taking Iron supplement po daily.  Assessment/Plan: Status post Cesarean section. Doing well postoperatively.  Discharge home with standard precautions and return to clinic in 4-6 weeks.   Orit Sanville, CNM. 07/29/2012, 8:34 AM

## 2012-07-29 NOTE — Discharge Summary (Signed)
Physician Discharge Summary  Patient ID: [name] [name] MRN: [id number] DOB/AGE: [birth year] 31 y.o.  Admit date: 07/25/2012 Discharge date: 07/29/2012  Admission Diagnoses: Admitted in labor,  GBS + status, GDM A1, diet controlled.  Discharge Diagnoses:  Active Problems:  Postpartum care following cesarean delivery (12/2)  Anemia of mother in pregnancy, delivered   Discharged Condition: stable  Hospital Course: Persistent face presentation leading to PLTCS, had GBS prophylaxis hile in labor  Consults: None  Significant Diagnostic Studies: labs: per ante routine - normal, microbiology: GBS culture positive - GBS prophylaxis in labor and radiology: Ultrasound: anatomy normal  Treatments: IV hydration, antibiotics: Ancef and analgesia: acetaminophen w/ codeine and ibuprofen  Discharge Exam: Blood pressure 114/71, pulse 61, temperature 97.8 F (36.6 C), temperature source Oral, resp. rate 20, height 5\' 5"  (1.651 m), weight 85.73 kg (189 lb), SpO2 98.00%, unknown if currently breastfeeding.  General appearance: alert, cooperative and no distress. Affect: AAO x 3 Lungs: CTAB CV: RRR Breasts: N/T Abdomen: N/T, soft, B/S x 4, BM x1 Incision: D&I , staples removed and steri-strips placed over the incision site. GI: normal function. GU: voiding with no problems Lochia: Min,Rubra Extremities: No edema, No swelling, bilaterally.  Disposition: Final discharge disposition not confirmed  Discharge Orders    Future Orders Please Complete By Expires   Diet - low sodium heart healthy      Diet general      Discharge instructions      Comments:   per [facility] Booklet       Medication List     As of 07/29/2012  9:05 AM    STOP taking these medications         calcium carbonate 500 MG chewable tablet   Commonly known as: TUMS - dosed in mg elemental calcium      Evening Primrose Oil 500 MG Caps      TAKE these medications         ibuprofen 600 MG tablet   Commonly known as:  ADVIL,MOTRIN   Take 1 tablet (600 mg total) by mouth every 6 (six) hours as needed for pain.      iron polysaccharides 150 MG capsule   Commonly known as: NIFEREX   Take 1 capsule (150 mg total) by mouth daily.      oxyCODONE-acetaminophen 5-325 MG per tablet   Commonly known as: PERCOCET/ROXICET   Take 1-2 tablets by mouth every 4 (four) hours as needed (moderate - severe pain).      prenatal multivitamin Tabs   Take 1 tablet by mouth daily.           Follow-up Information    Follow up with [facility] In 6 weeks. (As needed)    Contact information:   [address] [place] [address] [phone]        PLTCS viable female, persistent face presentation. Uncomplicated recovery.  Signed: Earl Gala, CNM. 07/29/2012, 9:05 AM

## 2012-07-31 ENCOUNTER — Inpatient Hospital Stay (HOSPITAL_COMMUNITY): Admission: RE | Admit: 2012-07-31 | Payer: 59 | Source: Ambulatory Visit

## 2012-12-20 ENCOUNTER — Emergency Department
Admission: EM | Admit: 2012-12-20 | Discharge: 2012-12-20 | Disposition: A | Discharge status: Home / Self Care | Payer: 59 | Source: Home / Self Care | Attending: Family Medicine | Admitting: Family Medicine

## 2012-12-20 ENCOUNTER — Encounter: Payer: Self-pay | Admitting: *Deleted

## 2012-12-20 DIAGNOSIS — R110 Nausea: Secondary | ICD-10-CM

## 2012-12-20 LAB — POCT CBC W AUTO DIFF (K'VILLE URGENT CARE)

## 2012-12-20 NOTE — ED Notes (Addendum)
[name] [name] c/o chilss and body aches 2 days ago. She developed intermittent sharp abdominal pain and nausea yesterday. Decreased appetite. Bowels have been WNL. HA today.

## 2012-12-20 NOTE — ED Provider Notes (Signed)
History     CSN: [id number]  Arrival date & time 12/20/12  1313   First MD Initiated Contact with Patient 12/20/12 1349      Chief Complaint  Patient presents with  . Abdominal Pain       HPI Comments: Patient states that 48 hours ago she developed myalgias, chills, fatigue, and low grade fever to 99.5.  Yesterday she developed nausea and abdominal cramps.  No vomiting.  She has felt slightly dizzy.  No pelvic pain or vaginal discharge.  No urinary symptoms.  The history is provided by the patient.    Past Medical History  Diagnosis Date  . Gestational diabetes     Past Surgical History  Procedure Laterality Date  . Cervical biopsy  w/ loop electrode excision    . Wisdom tooth extraction    . Cesarean section  07/26/2012    Procedure: CESAREAN SECTION;  Surgeon: Serita Kyle, MD;  Location: [facility];  Service: Obstetrics;  Laterality: N/A;  Primary Cesarean Section Delivery Baby Girl @  [phone], Apgars 8/9     Family History  Problem Relation Age of Onset  . Rheum arthritis Mother   . Melanoma Mother   . COPD Father   . Peripheral vascular disease Father   . Deep vein thrombosis Father   . Hypertension Father     History  Substance Use Topics  . Smoking status: Never Smoker   . Smokeless tobacco: Never Used  . Alcohol Use: No    OB History   Grav Para Term Preterm Abortions TAB SAB Ect Mult Living   1 1 1  0 0 0 0 0 0 1      Review of Systems  Constitutional: Positive for fever, chills, appetite change and fatigue.  Eyes: Negative.   Respiratory: Negative.   Cardiovascular: Negative.   Gastrointestinal: Positive for nausea and abdominal pain. Negative for vomiting, diarrhea, constipation, blood in stool and abdominal distention.  Endocrine: Negative.   Genitourinary: Negative.   Musculoskeletal: Positive for myalgias.  Skin: Negative.   Neurological: Positive for headaches.    Allergies  Ceclor; Latex; and Sulfa antibiotics  Home Medications    Current Outpatient Rx  Name  Route  Sig  Dispense  Refill  . norethindrone-ethinyl estradiol-iron (ESTROSTEP FE,TILIA FE,TRI-LEGEST FE) 1-20/1-30/1-35 MG-MCG tablet   Oral   Take 1 tablet by mouth daily. "mini pill"         . ibuprofen (ADVIL,MOTRIN) 600 MG tablet   Oral   Take 1 tablet (600 mg total) by mouth every 6 (six) hours as needed for pain.   30 tablet   2   . iron polysaccharides (NIFEREX) 150 MG capsule   Oral   Take 1 capsule (150 mg total) by mouth daily.   30 capsule   3   . Prenatal Vit-Fe Fumarate-FA (PRENATAL MULTIVITAMIN) TABS   Oral   Take 1 tablet by mouth daily.           BP 113/75  Pulse 62  Temp(Src) 97.7 F (36.5 C) (Oral)  Resp 14  Ht 5\' 5"  (1.651 m)  Wt 165 lb (74.844 kg)  BMI 27.46 kg/m2  SpO2 99%  Breastfeeding? Yes  Physical Exam Nursing notes and Vital Signs reviewed. Appearance:  Patient appears healthy, stated age, and in no acute distress Eyes:  Pupils are equal, round, and reactive to light and accomodation.  Extraocular movement is intact.  Conjunctivae are not inflamed  Ears:  Canals normal.  Tympanic membranes normal.  Nose:  Normal turbinates.  No sinus tenderness.   Mouth/Pharynx:  Normal; moist mucous membranes  Neck:  Supple.  No adenopathy Lungs:  Clear to auscultation.  Breath sounds are equal.  Heart:  Regular rate and rhythm without murmurs, rubs, or gallops.  Abdomen:  Mild peri-umbilical tenderness without masses or hepatosplenomegaly.  Bowel sounds are present.  No CVA or flank tenderness.  Extremities:  No edema.  No calf tenderness Skin:  No rash present.   ED Course  Procedures  none  Labs Reviewed  POCT CBC W AUTO DIFF ([facility]) WBC 3.7; LY 54.8; MO 9.3; GR 35.9; Hgb 13.7; Platelets 140       1. Nausea alone; suspect viral gastroenteritis; normal white blood count is reassuring.      MDM  Begin clear liquids for about 12 hours, then may begin a BRAT diet when nausea and dizziness  resolved.  Then gradually advance to a regular diet as tolerated.  Avoid milk products until well. To decrease diarrhea after nausea/vomiting resolved, mix one heaping tablespoon Citrucel (methylcellulose) in 8 oz water and drink one to three times daily.  If symptoms become significantly worse during the night or over the weekend, proceed to the local emergency room. Followup with Family Doctor if not improved in about 4 days.        Lattie Haw, MD 12/24/12 [phone]

## 2013-05-27 ENCOUNTER — Emergency Department (HOSPITAL_COMMUNITY)
Admission: EM | Admit: 2013-05-27 | Discharge: 2013-05-27 | Disposition: A | Discharge status: Home / Self Care | Payer: 59 | Source: Home / Self Care | Attending: Emergency Medicine | Admitting: Emergency Medicine

## 2013-05-27 ENCOUNTER — Encounter (HOSPITAL_COMMUNITY): Payer: Self-pay | Admitting: Emergency Medicine

## 2013-05-27 DIAGNOSIS — L03213 Periorbital cellulitis: Secondary | ICD-10-CM

## 2013-05-27 DIAGNOSIS — H00039 Abscess of eyelid unspecified eye, unspecified eyelid: Secondary | ICD-10-CM

## 2013-05-27 MED ORDER — TETRACAINE HCL 0.5 % OP SOLN
OPHTHALMIC | Status: AC
  Filled 2013-05-27: qty 2

## 2013-05-27 MED ORDER — TETRACAINE HCL 0.5 % OP SOLN
2.0000 [drp] | Freq: Once | OPHTHALMIC | Status: AC
  Administered 2013-05-27: 2 [drp] via OPHTHALMIC

## 2013-05-27 MED ORDER — CLINDAMYCIN HCL 300 MG PO CAPS: 300.0000 mg | ORAL_CAPSULE | Freq: Three times a day (TID) | ORAL | Status: AC

## 2013-05-27 NOTE — ED Notes (Signed)
Eye box and tetracaine at bedside 

## 2013-05-27 NOTE — ED Notes (Addendum)
Visual acuity done by examining resident; was not recorded by clinical staff

## 2013-05-27 NOTE — ED Notes (Signed)
Right eye painful.  Patient reports her ten month old scratched near her right eye on Tuesday, with each day since then eye has become more sore.  Today , blinking and moving eye has been painful.  Patient reports a co-worker has had an eye issue recently

## 2013-05-27 NOTE — ED Provider Notes (Signed)
CSN: [id number]     Arrival date & time 05/27/13  1929 History   First MD Initiated Contact with Patient 05/27/13 1949     Chief Complaint  Patient presents with  . Eye Pain   (Consider location/radiation/quality/duration/timing/severity/associated sxs/prior Treatment) HPI Comments: Patient notes that 28 month old daughter swiped at her right eye 3 days ago and did not have any immediate pain at the time, but it has been gradually worsening ever since. She feels like she has a "headache just above the eye" and pain is worsened by moving the eye. However, she is able to move it in all directions. She has mild swelling above the eye, but no redness or drainage. She changes in vision and no diplopia. She does not wear prescription glasses or contact lenses. She has no history of eye injury. She works as a Engineer, civil (consulting) at [employer] and denies any ocular exposure to body fluids or chemical while at work.   Patient is a 32 y.o. female presenting with eye pain.  Eye Pain This is a new problem. The current episode started yesterday. The problem occurs constantly. The problem has been gradually worsening. Associated symptoms include headaches. Pertinent negatives include no chest pain, no abdominal pain and no shortness of breath. Exacerbated by: Moving eye. Nothing relieves the symptoms. She has tried nothing for the symptoms.    Past Medical History  Diagnosis Date  . Gestational diabetes    Past Surgical History  Procedure Laterality Date  . Cervical biopsy  w/ loop electrode excision    . Wisdom tooth extraction    . Cesarean section  07/26/2012    Procedure: CESAREAN SECTION;  Surgeon: Serita Kyle, MD;  Location: [facility];  Service: Obstetrics;  Laterality: N/A;  Primary Cesarean Section Delivery Baby Girl @  [phone], Apgars 8/9    Family History  Problem Relation Age of Onset  . Rheum arthritis Mother   . Melanoma Mother   . COPD Father   . Peripheral vascular disease Father   . Deep vein  thrombosis Father   . Hypertension Father    History  Substance Use Topics  . Smoking status: Never Smoker   . Smokeless tobacco: Never Used  . Alcohol Use: No   OB History   Grav Para Term Preterm Abortions TAB SAB Ect Mult Living   1 1 1  0 0 0 0 0 0 1     Review of Systems  Constitutional: Negative for fever, chills and fatigue.  Eyes: Positive for pain. Negative for photophobia, discharge, redness, itching and visual disturbance.  Respiratory: Negative for shortness of breath.   Cardiovascular: Negative for chest pain.  Gastrointestinal: Negative for abdominal pain.  Endocrine: Negative.   Genitourinary: Negative.   Musculoskeletal: Negative.   Skin: Negative.   Neurological: Positive for headaches.    Allergies  Ceclor; Latex; and Sulfa antibiotics  Home Medications   Current Outpatient Rx  Name  Route  Sig  Dispense  Refill  . clindamycin (CLEOCIN) 300 MG capsule   Oral   Take 1 capsule (300 mg total) by mouth 3 (three) times daily. Take for 7 days.   21 capsule   0   . ibuprofen (ADVIL,MOTRIN) 600 MG tablet   Oral   Take 1 tablet (600 mg total) by mouth every 6 (six) hours as needed for pain.   30 tablet   2   . iron polysaccharides (NIFEREX) 150 MG capsule   Oral   Take 1 capsule (150 mg  total) by mouth daily.   30 capsule   3   . norethindrone-ethinyl estradiol-iron (ESTROSTEP FE,TILIA FE,TRI-LEGEST FE) 1-20/1-30/1-35 MG-MCG tablet   Oral   Take 1 tablet by mouth daily. "mini pill"         . Prenatal Vit-Fe Fumarate-FA (PRENATAL MULTIVITAMIN) TABS   Oral   Take 1 tablet by mouth daily.          BP 117/85  Pulse 64  Temp(Src) 98 F (36.7 C) (Oral)  SpO2 97% Physical Exam  Constitutional: She appears well-developed and well-nourished. No distress.  HENT:  Head: Normocephalic and atraumatic.  Eyes: Conjunctivae and EOM are normal. Pupils are equal, round, and reactive to light. Lids are everted and swept, no foreign bodies found. Right  eye exhibits no chemosis, no discharge and no exudate. No foreign body present in the right eye. Left eye exhibits no chemosis, no discharge and no exudate. No foreign body present in the left eye.  Fundoscopic exam:      The right eye shows no hemorrhage and no papilledema.       The left eye shows no hemorrhage and no papilledema.  Slit lamp exam:      The right eye shows no fluorescein uptake and no anterior chamber bulge.    No proptosis; no evidence of corneal abrasion on the floor seen by exam of right eye  Neck: Normal range of motion. Neck supple.  Cardiovascular: Normal rate, regular rhythm and normal heart sounds.   Pulmonary/Chest: Effort normal and breath sounds normal.    ED Course  Procedures (including critical care time) Labs Review Labs Reviewed - No data to display Imaging Review No results found.  MDM   1. Preseptal cellulitis of right eye    The patient was evaluated extensively for this right eye pain with mild swelling. The most likely diagnosis based on history and physical is preseptal cellulitis. However, we took great caution to rule out orbital cellulitis. Given that she does not have any proptosis, ophthalmoplegia, or visual disturbances, we believe this to be less likely. Therefore she'll be treated with clindamycin 300 mg 3 times a day x7 days for potential MRSA cellulitis. She was given strict precautions for returning to the urgent care or emergency department in case the infection worsens or does not respond to antibiotic. Additionally, she was instructed not to breast feed for taking the antibiotic. The patient now is understanding and was in agreement with this plan.    Garnetta Buddy, MD 05/27/13 2048

## 2013-05-28 NOTE — ED Provider Notes (Signed)
Medical screening examination/treatment/procedure(s) were performed by a resident physician or non-physician practitioner and as the supervising physician I was immediately available for consultation/collaboration.  Clementeen Graham, MD   Rodolph Bong, MD 05/28/13 [phone]

## 2013-08-21 ENCOUNTER — Emergency Department (INDEPENDENT_AMBULATORY_CARE_PROVIDER_SITE_OTHER): Payer: 59

## 2013-08-21 ENCOUNTER — Emergency Department (HOSPITAL_COMMUNITY)
Admission: EM | Admit: 2013-08-21 | Discharge: 2013-08-21 | Disposition: A | Discharge status: Home / Self Care | Payer: 59 | Source: Home / Self Care | Attending: Emergency Medicine | Admitting: Emergency Medicine

## 2013-08-21 ENCOUNTER — Encounter (HOSPITAL_COMMUNITY): Payer: Self-pay | Admitting: Emergency Medicine

## 2013-08-21 DIAGNOSIS — J111 Influenza due to unidentified influenza virus with other respiratory manifestations: Secondary | ICD-10-CM

## 2013-08-21 MED ORDER — OSELTAMIVIR PHOSPHATE 75 MG PO CAPS: 75.0000 mg | ORAL_CAPSULE | Freq: Two times a day (BID) | ORAL | Status: DC

## 2013-08-21 MED ORDER — HYDROCOD POLST-CHLORPHEN POLST 10-8 MG/5ML PO LQCR: 5.0000 mL | Freq: Two times a day (BID) | ORAL | Status: DC | PRN

## 2013-08-21 NOTE — ED Provider Notes (Signed)
Chief Complaint   Chief Complaint  Patient presents with  . 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 She denies any nausea, vomiting, or diarrhea. She has been exposed to several patients who has had influenza.  Review of Systems   Other than as noted above, the patient denies any of the following symptoms: Systemic:  No fevers, chills, sweats, or myalgias. Eye:  No redness or discharge. ENT:  No ear pain, headache, nasal congestion, drainage, sinus pressure, or sore throat. Neck:  No neck pain, stiffness, or swollen glands. Lungs:  No cough, sputum production, hemoptysis, wheezing, chest tightness, shortness of breath or chest pain. GI:  No abdominal pain, nausea, vomiting or diarrhea.  PMFSH   Past medical history, family history, social history, meds, and allergies were reviewed. She is allergic to sulfa, Ceclor, and latex. Her only medication is birth control pills.  Physical exam   Vital signs:  BP 112/68  Pulse 60  Temp(Src) 98.5 F (36.9 C) (Oral)  Resp 18  SpO2 100%  Breastfeeding? No General:  Alert and oriented.  In no distress.  Skin warm and dry. Eye:  No conjunctival injection or drainage. Lids were normal. ENT:  TMs and canals were normal, without erythema or inflammation.  Nasal mucosa was clear and uncongested, without drainage.  Mucous membranes were moist.  Pharynx was clear with no exudate or drainage.  There were no oral ulcerations or lesions. Neck:  Supple, no adenopathy, tenderness or mass. Lungs:  No respiratory distress.  Lungs were clear to auscultation, without wheezes, rales or rhonchi.  Breath sounds were clear and equal bilaterally.  Heart:  Regular rhythm,  without gallops, murmers or rubs. Skin:  Clear, warm, and dry, without rash or lesions.  Radiology   Dg Chest 2 View  08/21/2013   CLINICAL DATA:  Cough and fever  EXAM: CHEST  2 VIEW  COMPARISON:  None.  FINDINGS: The heart size and mediastinal contours are within normal limits. Both lungs are clear. The visualized skeletal structures are unremarkable.  IMPRESSION: No active cardiopulmonary disease.   Electronically Signed   By: Marlan Palau M.D.   On: 08/21/2013 18:49   Assessment     The encounter diagnosis was Influenza-like illness.   Plan    1.  Meds:  The following meds were prescribed:   Discharge Medication List as of 08/21/2013  6:59 PM    START taking these medications   Details  chlorpheniramine-HYDROcodone (TUSSIONEX) 10-8 MG/5ML LQCR Take 5 mLs by mouth every 12 (twelve) hours as needed for cough., Starting 08/21/2013, Until Discontinued, Normal    oseltamivir (TAMIFLU) 75 MG capsule Take 1 capsule (75 mg total) by mouth every 12 (twelve) hours., Starting 08/21/2013, Until Discontinued, Normal        2.  Patient Education/Counseling:  The patient was given appropriate handouts, self care instructions, and instructed in symptomatic relief.  Instructed to get extra fluids, rest, and use a cool mist vaporizer.   3.  Follow up:  The patient was told to follow up here if no better in 3 to 4 days, or sooner if becoming worse in any way, and given some red flag symptoms such as increasing fever, difficulty breathing, chest pain,  or persistent vomiting which would prompt immediate return.  Follow up here as needed.      Reuben Likes, MD 08/21/13 2040

## 2013-08-21 NOTE — ED Notes (Signed)
Pt c/o cold/flu like sxs onset 4 days w/sxs that include: productive cough, chest d/c due to cough, fevers, runny nose, congestion Denies: n/v/d, SOB, wheezing... Had ibup today around 1100 and taking OTC She is alert w/no signs of acute distress.

## 2013-08-31 ENCOUNTER — Encounter: Payer: Self-pay | Admitting: Family

## 2013-08-31 ENCOUNTER — Ambulatory Visit (INDEPENDENT_AMBULATORY_CARE_PROVIDER_SITE_OTHER): Payer: 59 | Admitting: Family

## 2013-08-31 VITALS — BP 118/82 | HR 55 | Temp 97.9°F | Resp 16 | Ht 65.25 in | Wt 176.1 lb

## 2013-08-31 DIAGNOSIS — L259 Unspecified contact dermatitis, unspecified cause: Secondary | ICD-10-CM

## 2013-08-31 DIAGNOSIS — Z0000 Encounter for general adult medical examination without abnormal findings: Secondary | ICD-10-CM

## 2013-08-31 DIAGNOSIS — L309 Dermatitis, unspecified: Secondary | ICD-10-CM

## 2013-08-31 LAB — HEPATIC FUNCTION PANEL
ALT: 22 U/L (ref 0–35)
AST: 17 U/L (ref 0–37)
Albumin: 4.2 g/dL (ref 3.5–5.2)
Alkaline Phosphatase: 51 U/L (ref 39–117)
BILIRUBIN DIRECT: 0.1 mg/dL (ref 0.0–0.3)
BILIRUBIN TOTAL: 0.4 mg/dL (ref 0.3–1.2)
Indirect Bilirubin: 0.3 mg/dL (ref 0.0–0.9)
Total Protein: 7.2 g/dL (ref 6.0–8.3)

## 2013-08-31 LAB — BASIC METABOLIC PANEL WITH GFR
BUN: 12 mg/dL (ref 6–23)
CO2: 31 mEq/L (ref 19–32)
Calcium: 9.2 mg/dL (ref 8.4–10.5)
Chloride: 103 mEq/L (ref 96–112)
Creat: 0.59 mg/dL (ref 0.50–1.10)
Glucose, Bld: 66 mg/dL — ABNORMAL LOW (ref 70–99)
POTASSIUM: 4.3 meq/L (ref 3.5–5.3)
SODIUM: 139 meq/L (ref 135–145)

## 2013-08-31 LAB — LIPID PANEL
CHOL/HDL RATIO: 3.5 ratio
CHOLESTEROL: 172 mg/dL (ref 0–200)
HDL: 49 mg/dL (ref >39)
LDL Cholesterol: 92 mg/dL (ref 0–99)
TRIGLYCERIDES: 154 mg/dL — AB (ref <150)
VLDL: 31 mg/dL (ref 0–40)

## 2013-08-31 LAB — CBC WITH DIFFERENTIAL/PLATELET
BASOS PCT: 0 % (ref 0–1)
Basophils Absolute: 0 10*3/uL (ref 0.0–0.1)
Eosinophils Absolute: 0.2 10*3/uL (ref 0.0–0.7)
Eosinophils Relative: 2 % (ref 0–5)
HCT: 42.3 % (ref 36.0–46.0)
HEMOGLOBIN: 14.3 g/dL (ref 12.0–15.0)
Lymphocytes Relative: 45 % (ref 12–46)
Lymphs Abs: 3 10*3/uL (ref 0.7–4.0)
MCH: 28 pg (ref 26.0–34.0)
MCHC: 33.8 g/dL (ref 30.0–36.0)
MCV: 82.9 fL (ref 78.0–100.0)
MONOS PCT: 6 % (ref 3–12)
Monocytes Absolute: 0.4 10*3/uL (ref 0.1–1.0)
NEUTROS ABS: 3.2 10*3/uL (ref 1.7–7.7)
NEUTROS PCT: 47 % (ref 43–77)
Platelets: 279 10*3/uL (ref 150–400)
RBC: 5.1 MIL/uL (ref 3.87–5.11)
RDW: 13.5 % (ref 11.5–15.5)
WBC: 6.7 10*3/uL (ref 4.0–10.5)

## 2013-08-31 MED ORDER — BETAMETHASONE DIPROPIONATE 0.05 % EX CREA: TOPICAL_CREAM | Freq: Two times a day (BID) | CUTANEOUS | Status: DC

## 2013-08-31 NOTE — Progress Notes (Signed)
Subjective:    Patient ID: [name] [name], female    DOB: September 13, 1980, 33 y.o.   MRN: [id number]  HPI  [name] is a 33 yr old female who presents today to establish care.  1) Skin problem- reports + eczema on her hands.  Get itchy and cracking/burning.  She has used hydrocortisone, tea tree oil. Lysine, minimal improvement with these measures.  She reports that in college she would get patches on her legs and arms and now mostly on her hands.   2) Preventative care  Immunizations: flu shot pu to date.  Tdap up to date Diet: reports fair diet  Exercise:  Reports exercises 2-3 times a week Body mass index is 29.09 kg/(m^2). Pap Smear: 1 year ago- normal  Review of Systems  Constitutional:       Weight stable.  HENT: Negative for rhinorrhea.   Respiratory: Negative for shortness of breath.        Reports mild residual cough from viral illness over christmas  Cardiovascular: Negative for chest pain.  Gastrointestinal: Negative for nausea, vomiting and diarrhea.  Genitourinary: Negative for dysuria, frequency and menstrual problem.  Musculoskeletal: Negative for arthralgias and myalgias.  Skin: Positive for rash.  Neurological: Negative for headaches.  Hematological: Does not bruise/bleed easily.  Psychiatric/Behavioral:       Denies depression/anxiety   Past Medical History  Diagnosis Date  . Gestational diabetes     History   Social History  . Marital Status: Single    Spouse Name: N/A    Number of Children: N/A  . Years of Education: N/A   Occupational History  . Not on file.   Social History Main Topics  . Smoking status: Never Smoker   . Smokeless tobacco: Never Used  . Alcohol Use: No  . Drug Use: No  . Sexual Activity: Yes    Birth Control/ Protection: Pill   Other Topics Concern  . 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 Cervical biopsy  w/ loop electrode excision      2001  . Wisdom tooth extraction    . Cesarean section  07/26/2012    Procedure: CESAREAN SECTION;  Surgeon: [name] Kyle, MD;  Location: [facility];  Service: Obstetrics;  Laterality: N/A;  Primary Cesarean Section Delivery Baby Girl @  [phone], Apgars 8/9     Family History  Problem Relation Age of Onset  . Rheum arthritis Mother   . Melanoma Mother   . COPD Father   . Peripheral vascular disease Father   . Deep vein thrombosis Father   . Hypertension Father   . Hepatitis C Father     Allergies  Allergen Reactions  . Ceclor [Cefaclor] Hives  . Latex Hives  . Sulfa Antibiotics Swelling    Childhood reaction    Current Outpatient Prescriptions on File Prior to Visit  Medication Sig Dispense Refill  . norethindrone-ethinyl estradiol-iron (ESTROSTEP FE,TILIA FE,TRI-LEGEST FE) 1-20/1-30/1-35 MG-MCG tablet Take 1 tablet by mouth daily. "mini pill"      . Prenatal Vit-Fe Fumarate-FA (PRENATAL MULTIVITAMIN) TABS Take 1 tablet by mouth daily.       No current facility-administered medications on file prior to visit.    BP  118/82  Pulse 55  Temp(Src) 97.9 F (36.6 C) (Oral)  Resp 16  Ht 5' 5.25" (1.657 m)  Wt 176 lb 1.3 oz (79.869 kg)  BMI 29.09 kg/m2  SpO2 99%  LMP 08/31/2013       Objective:   Physical Exam  Physical Exam  Constitutional: She is oriented to person, place, and time. She appears well-developed and well-nourished. No distress.  HENT:  Head: Normocephalic and atraumatic.  Right Ear: Tympanic membrane and ear canal normal.  Left Ear: Tympanic membrane and ear canal normal.  Mouth/Throat: Oropharynx is clear and moist.  Eyes: Pupils are equal, round, and reactive to light. No scleral icterus.  Neck: Normal range of motion. No thyromegaly present.  Cardiovascular: Normal rate and regular rhythm.   No murmur heard. Pulmonary/Chest: Effort  normal and breath sounds normal. No respiratory distress. He has no wheezes. She has no rales. She exhibits no tenderness.  Abdominal: Soft. Bowel sounds are normal. He exhibits no distension and no mass. There is no tenderness. There is no rebound and no guarding.  Musculoskeletal: She exhibits no edema.  Lymphadenopathy:    She has no cervical adenopathy.  Neurological: She is alert and oriented to person, place, and time. She has normal reflexes. She exhibits normal muscle tone. Coordination normal.  Skin: Skin is warm and dry. skin- scaling, cracking noted on palmar surface Psychiatric: She has a normal mood and affect. Her behavior is normal. Judgment and thought content normal.  Breasts: Examined lying Right: Without masses, retractions, discharge or axillary adenopathy.  Left: Without masses, retractions, discharge or axillary adenopathy.  Pelvic: deferred          Assessment & Plan:          Assessment & Plan:

## 2013-08-31 NOTE — Assessment & Plan Note (Addendum)
Discussed healthy diet, exercise, weight loss, BSE.  Obtain non-fasting lab work today.

## 2013-08-31 NOTE — Patient Instructions (Signed)
Please complete lab work prior to leaving. Follow up in 1 year, sooner if problems/concerns.  

## 2013-08-31 NOTE — Progress Notes (Signed)
Pre visit review using our clinic review tool, if applicable. No additional management support is needed unless otherwise documented below in the visit note. 

## 2013-08-31 NOTE — Assessment & Plan Note (Signed)
Trial of aquaphor and diprolene cream.

## 2013-09-01 LAB — URINALYSIS, ROUTINE W REFLEX MICROSCOPIC
BILIRUBIN URINE: NEGATIVE
Glucose, UA: NEGATIVE mg/dL
HGB URINE DIPSTICK: NEGATIVE
KETONES UR: NEGATIVE mg/dL
Leukocytes, UA: NEGATIVE
Nitrite: NEGATIVE
PROTEIN: NEGATIVE mg/dL
Specific Gravity, Urine: 1.022 (ref 1.005–1.030)
UROBILINOGEN UA: 0.2 mg/dL (ref 0.0–1.0)
pH: 6.5 (ref 5.0–8.0)

## 2013-09-02 ENCOUNTER — Encounter: Payer: Self-pay | Admitting: Family

## 2014-06-26 ENCOUNTER — Encounter: Payer: Self-pay | Admitting: Family

## 2014-09-04 ENCOUNTER — Encounter: Payer: 59 | Admitting: Family

## 2014-10-10 ENCOUNTER — Encounter: Payer: Self-pay | Admitting: Family

## 2014-10-10 ENCOUNTER — Ambulatory Visit (INDEPENDENT_AMBULATORY_CARE_PROVIDER_SITE_OTHER): Payer: BLUE CROSS/BLUE SHIELD | Admitting: Family

## 2014-10-10 VITALS — BP 114/70 | HR 58 | Temp 98.0°F | Resp 16 | Ht 65.5 in | Wt 172.2 lb

## 2014-10-10 DIAGNOSIS — Z0000 Encounter for general adult medical examination without abnormal findings: Secondary | ICD-10-CM

## 2014-10-10 LAB — LIPID PANEL
CHOLESTEROL: 150 mg/dL (ref 0–200)
HDL: 52.8 mg/dL (ref >39.00)
LDL Cholesterol: 78 mg/dL (ref 0–99)
NONHDL: 97.2
Total CHOL/HDL Ratio: 3
Triglycerides: 96 mg/dL (ref 0.0–149.0)
VLDL: 19.2 mg/dL (ref 0.0–40.0)

## 2014-10-10 LAB — CBC WITH DIFFERENTIAL/PLATELET
BASOS PCT: 0.5 % (ref 0.0–3.0)
Basophils Absolute: 0 10*3/uL (ref 0.0–0.1)
Eosinophils Absolute: 0.1 10*3/uL (ref 0.0–0.7)
Eosinophils Relative: 1.6 % (ref 0.0–5.0)
HCT: 39.5 % (ref 36.0–46.0)
HEMOGLOBIN: 13.1 g/dL (ref 12.0–15.0)
Lymphocytes Relative: 35.3 % (ref 12.0–46.0)
Lymphs Abs: 1.7 10*3/uL (ref 0.7–4.0)
MCHC: 33.3 g/dL (ref 30.0–36.0)
MCV: 84 fl (ref 78.0–100.0)
MONOS PCT: 5.7 % (ref 3.0–12.0)
Monocytes Absolute: 0.3 10*3/uL (ref 0.1–1.0)
NEUTROS ABS: 2.7 10*3/uL (ref 1.4–7.7)
Neutrophils Relative %: 56.9 % (ref 43.0–77.0)
Platelets: 224 10*3/uL (ref 150.0–400.0)
RBC: 4.7 Mil/uL (ref 3.87–5.11)
RDW: 13.2 % (ref 11.5–15.5)
WBC: 4.8 10*3/uL (ref 4.0–10.5)

## 2014-10-10 LAB — BASIC METABOLIC PANEL
BUN: 14 mg/dL (ref 6–23)
CALCIUM: 9.2 mg/dL (ref 8.4–10.5)
CO2: 30 mEq/L (ref 19–32)
Chloride: 106 mEq/L (ref 96–112)
Creatinine, Ser: 0.78 mg/dL (ref 0.40–1.20)
GFR: 90.3 mL/min (ref >60.00)
Glucose, Bld: 83 mg/dL (ref 70–99)
POTASSIUM: 3.8 meq/L (ref 3.5–5.1)
Sodium: 139 mEq/L (ref 135–145)

## 2014-10-10 LAB — URINALYSIS, ROUTINE W REFLEX MICROSCOPIC
BILIRUBIN URINE: NEGATIVE
HGB URINE DIPSTICK: NEGATIVE
KETONES UR: NEGATIVE
Nitrite: NEGATIVE
Specific Gravity, Urine: 1.025 (ref 1.000–1.030)
Total Protein, Urine: NEGATIVE
UROBILINOGEN UA: 0.2 (ref 0.0–1.0)
Urine Glucose: NEGATIVE
pH: 7 (ref 5.0–8.0)

## 2014-10-10 LAB — TSH: TSH: 1.56 u[IU]/mL (ref 0.35–4.50)

## 2014-10-10 LAB — HEPATIC FUNCTION PANEL
ALBUMIN: 4 g/dL (ref 3.5–5.2)
ALT: 20 U/L (ref 0–35)
AST: 19 U/L (ref 0–37)
Alkaline Phosphatase: 58 U/L (ref 39–117)
Bilirubin, Direct: 0.1 mg/dL (ref 0.0–0.3)
Total Bilirubin: 0.5 mg/dL (ref 0.2–1.2)
Total Protein: 7.6 g/dL (ref 6.0–8.3)

## 2014-10-10 MED ORDER — FLUCONAZOLE 150 MG PO TABS: ORAL_TABLET | ORAL | Status: DC

## 2014-10-10 NOTE — Assessment & Plan Note (Addendum)
Immunizations up to date, continue exercise, work on healthy diet. Pap to schedule with GYN.  Diflucan given for yeast infection.

## 2014-10-10 NOTE — Progress Notes (Signed)
Pre visit review using our clinic review tool, if applicable. No additional management support is needed unless otherwise documented below in the visit note. 

## 2014-10-10 NOTE — Progress Notes (Signed)
Subjective:    Patient ID: [name] [name], female    DOB: 1980-10-14, 34 y.o.   MRN: [id number]  HPI  [name] is a 34 year old female who presents today for cpx.  Immunizations:  Tetanus up to date.  Flu shot up to date Diet: not great, living with in laws, closes tomorrow Exercise: goes to the gym- goes 3 days a week at least Pap Smear: (due- will complete with Dr. Cherly Hensen)  Wt Readings from Last 3 Encounters:  10/10/14 172 lb 3.2 oz (78.109 kg)  08/31/13 176 lb 1.3 oz (79.869 kg)  12/20/12 165 lb (74.844 kg)     Review of Systems  Constitutional: Negative for unexpected weight change.  HENT: Negative for hearing loss.        Mild sore throat since today- daughter has cold, thinks she may be coming down with same cold  Eyes: Negative for visual disturbance.  Respiratory: Negative for cough and shortness of breath.   Cardiovascular: Negative for chest pain and leg swelling.  Gastrointestinal: Negative for diarrhea and constipation.  Genitourinary: Negative for dysuria and frequency.       Recent doxy for gum infection, seeing dental.  Now has yeast infection from abx, request diflucan as she is sensitive to monistat  Musculoskeletal: Negative for myalgias and arthralgias.  Skin:       Eczema- unchanged  Neurological: Negative for headaches.  Hematological: Negative for adenopathy.  Psychiatric/Behavioral:       Denies depression/anxiety   Past Medical History  Diagnosis Date  . Gestational diabetes     History   Social History  . Marital Status: Single    Spouse Name: N/A  . Number of Children: N/A  . Years of Education: N/A   Occupational History  . Not on file.   Social History Main Topics  . Smoking status: Never Smoker   . Smokeless tobacco: Never Used  . Alcohol Use: No  . Drug Use: No  . Sexual Activity: Yes    Birth Control/ Protection: Pill   Other Topics Concern  . 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 Cervical biopsy  w/ loop electrode excision      2001  . Wisdom tooth extraction    . Cesarean section  07/26/2012    Procedure: CESAREAN SECTION;  Surgeon: Serita Kyle, MD;  Location: [facility];  Service: Obstetrics;  Laterality: N/A;  Primary Cesarean Section Delivery Baby Girl @  [phone], Apgars 8/9     Family History  Problem Relation Age of Onset  . Rheum arthritis Mother   . Melanoma Mother   . COPD Father   . Peripheral vascular disease Father   . Deep vein thrombosis Father   . Hypertension Father   . Hepatitis C Father     Allergies  Allergen Reactions  . Ceclor [Cefaclor] Hives  . Latex Hives  . Sulfa Antibiotics Swelling    Childhood reaction    No current outpatient prescriptions on file prior to visit.   No current facility-administered medications on file prior to visit.    BP 114/70 mmHg  Pulse 58  Temp(Src) 98 F (36.7 C) (Oral)  Resp 16  Ht 5' 5.5" (1.664  m)  Wt 172 lb 3.2 oz (78.109 kg)  BMI 28.21 kg/m2  SpO2 100%  LMP 10/03/2014       Objective:   Physical Exam  Physical Exam  Constitutional: She is oriented to person, place, and time. She appears well-developed and well-nourished. No distress.  HENT:  Head: Normocephalic and atraumatic.  Right Ear: Tympanic membrane and ear canal normal.  Left Ear: Tympanic membrane and ear canal normal.  Mouth/Throat: Oropharynx is clear and moist.  Eyes: Pupils are equal, round, and reactive to light. No scleral icterus.  Neck: Normal range of motion. No thyromegaly present.  Cardiovascular: Normal rate and regular rhythm.   No murmur heard. Pulmonary/Chest: Effort normal and breath sounds normal. No respiratory distress. He has no wheezes. She has no rales. She exhibits no tenderness.  Abdominal: Soft. Bowel sounds are normal. He  exhibits no distension and no mass. There is no tenderness. There is no rebound and no guarding.  Musculoskeletal: She exhibits no edema.  Lymphadenopathy:    She has no cervical adenopathy.  Neurological: She is alert and oriented to person, place, and time. She has 2+ patellar reflexes. She exhibits normal muscle tone. Coordination normal.  Skin: Skin is warm and dry.  Psychiatric: She has a normal mood and affect. Her behavior is normal. Judgment and thought content normal.  Breasts: Examined lying Right: Without masses, retractions, discharge or axillary adenopathy.  Left: Without masses, retractions, discharge or axillary adenopathy.           Assessment & Plan:         Assessment & Plan:

## 2014-10-10 NOTE — Patient Instructions (Signed)
Start diflucan. Complete lab work prior to leaving.

## 2014-10-12 ENCOUNTER — Encounter: Payer: Self-pay | Admitting: Family

## 2014-10-27 NOTE — Telephone Encounter (Signed)
pls mail letter re: unread message.

## 2014-10-27 NOTE — Telephone Encounter (Signed)
Letter mailed to pt.  

## 2015-01-01 IMAGING — CR DG CHEST 2V
2 series · 2 of 2 positions shown · non-contrast
Comparison: None.

CLINICAL DATA: Cough and fever

EXAM:
CHEST  2 VIEW

[view not recorded (1 of 2)]
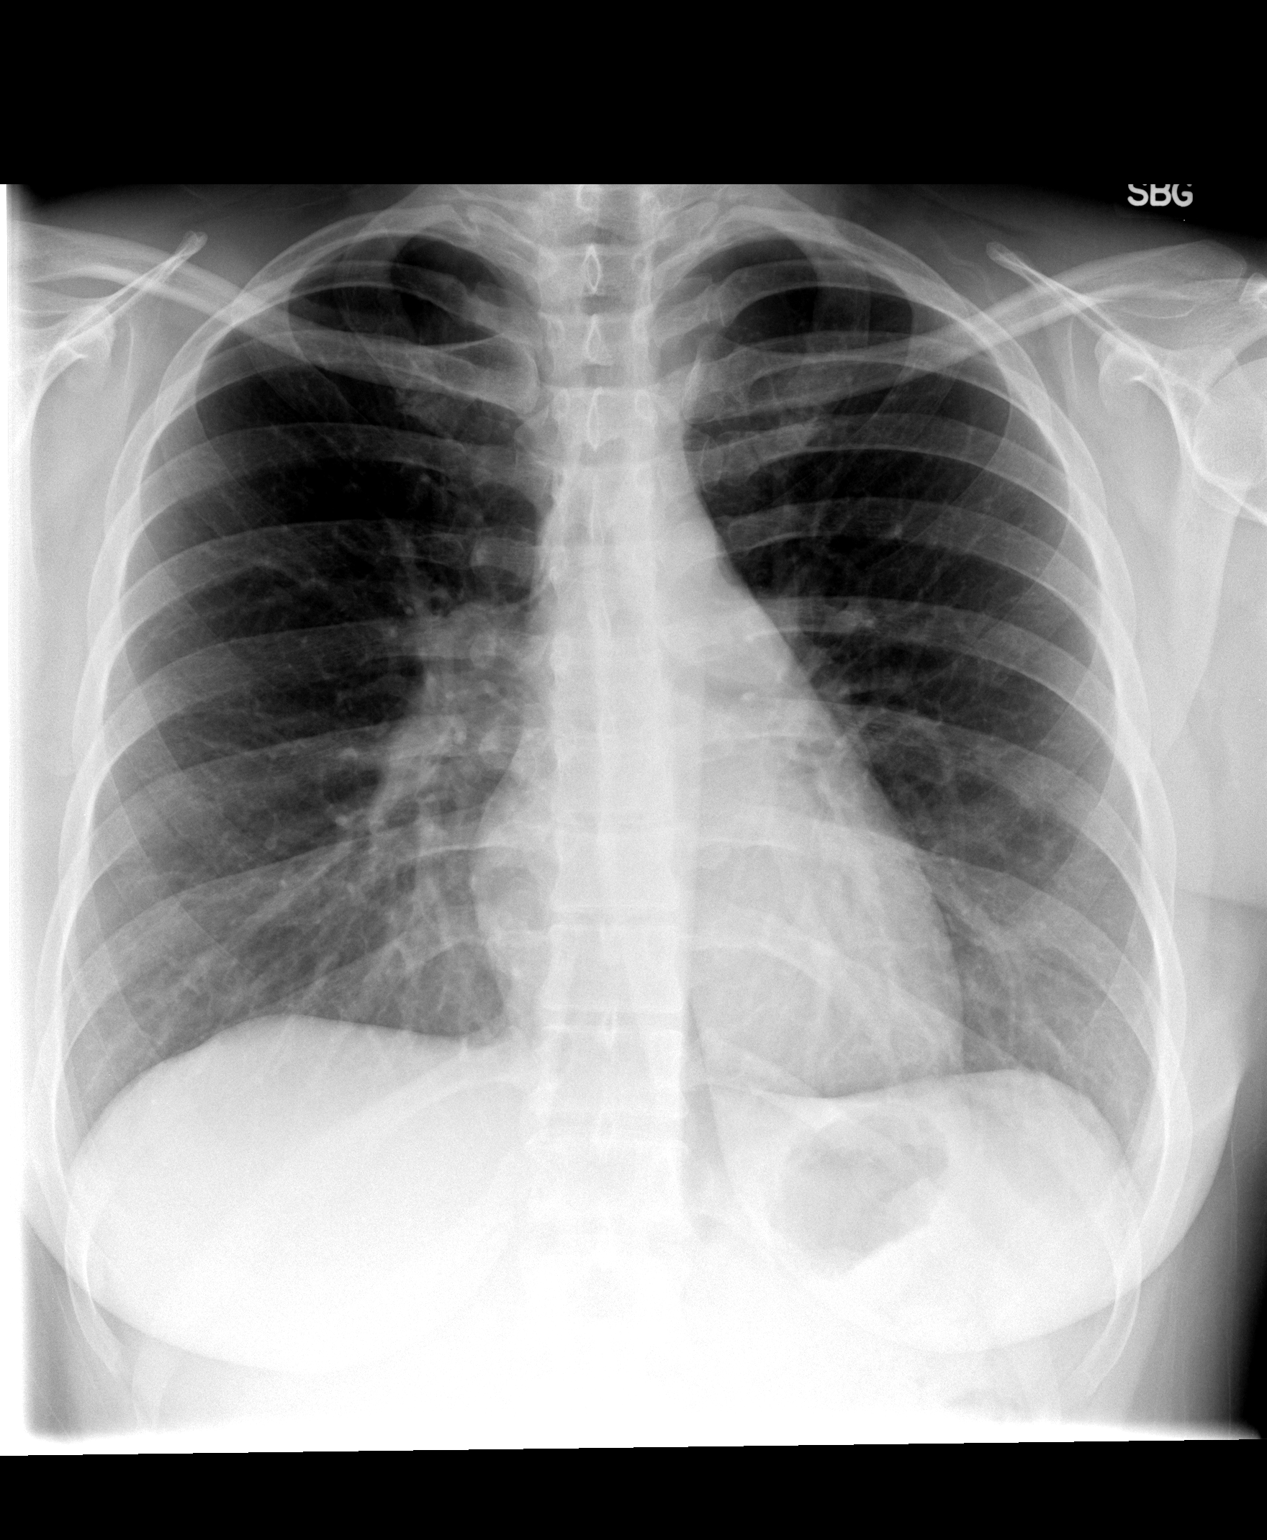

[view not recorded (2 of 2)]
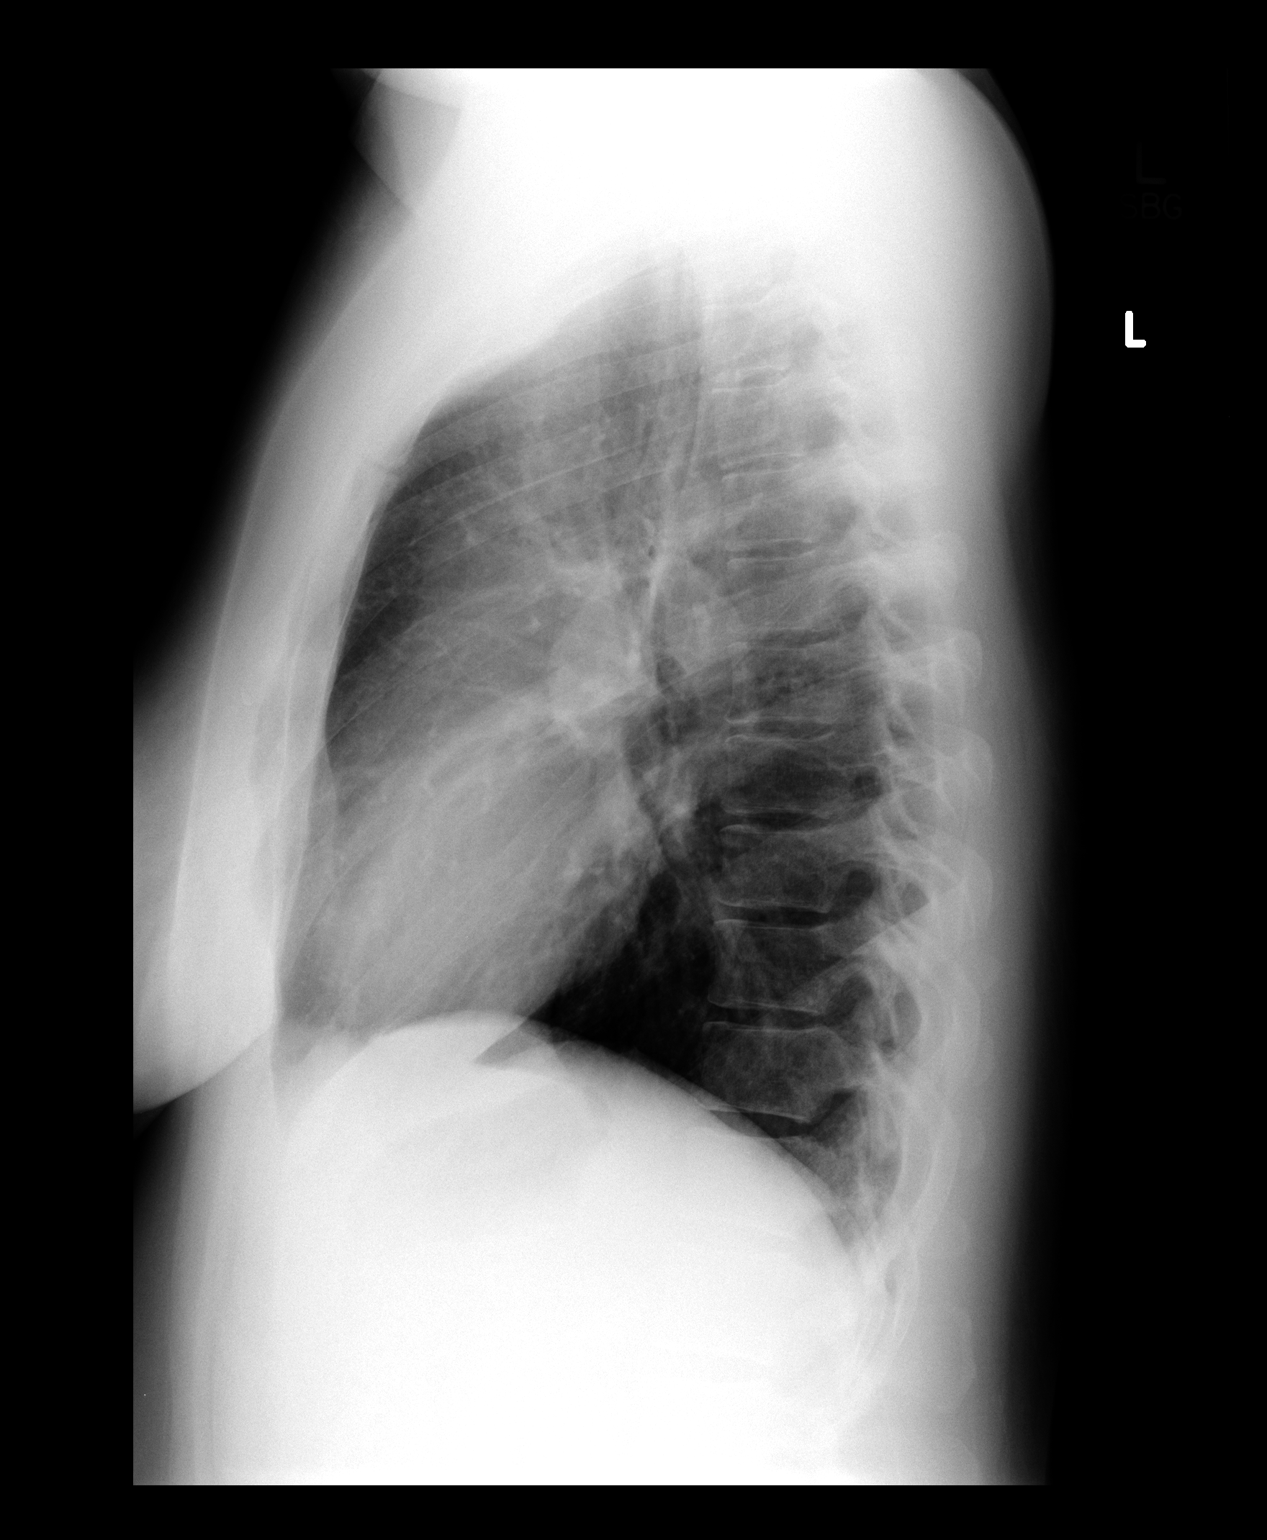

[2 of 2 positions shown; findings below may reference images not displayed]

FINDINGS: The heart size and mediastinal contours are within normal limits.
Both lungs are clear. The visualized skeletal structures are
unremarkable.
IMPRESSION: No active cardiopulmonary disease.

## 2015-10-18 ENCOUNTER — Emergency Department
Admission: EM | Admit: 2015-10-18 | Discharge: 2015-10-18 | Disposition: A | Discharge status: Home / Self Care | Payer: Federal, State, Local not specified - PPO | Source: Home / Self Care | Attending: Family Medicine | Admitting: Family Medicine

## 2015-10-18 ENCOUNTER — Encounter: Payer: Self-pay | Admitting: *Deleted

## 2015-10-18 DIAGNOSIS — H8111 Benign paroxysmal vertigo, right ear: Secondary | ICD-10-CM

## 2015-10-18 LAB — POCT URINE PREGNANCY: PREG TEST UR: NEGATIVE

## 2015-10-18 MED ORDER — MECLIZINE HCL 25 MG PO TABS: 25.0000 mg | ORAL_TABLET | Freq: Three times a day (TID) | ORAL | Status: DC | PRN

## 2015-10-18 MED ORDER — METOCLOPRAMIDE HCL 5 MG/ML IJ SOLN
5.0000 mg | Freq: Once | INTRAMUSCULAR | Status: AC
  Administered 2015-10-18: 5 mg via INTRAMUSCULAR

## 2015-10-18 NOTE — ED Notes (Addendum)
Pt c/o dizzines and nausea x last night. She has taken zofran and meclizine without relief. She started new BCP on 10/14/15.

## 2015-10-18 NOTE — ED Provider Notes (Signed)
CSN: [id number]     Arrival date & time 10/18/15  1844 History   First MD Initiated Contact with Patient 10/18/15 1946     Chief Complaint  Patient presents with  . Dizziness     HPI Comments: At about midnight yesterday while at work, patient suddenly felt off-balance, slightly dizzy, with nausea but no vomiting.  She feels well otherwise.  No fevers, chills, and sweats.  No recent URI symptoms.  She tried Antivert with slight improvement. She just started taking Sprintec four days ago.  Patient is a 35 y.o. female presenting with dizziness. The history is provided by the patient.  Dizziness Quality:  Imbalance Severity:  Mild Onset quality:  Sudden Duration:  1 day Timing:  Intermittent Progression:  Improving Chronicity:  New Context: bending over and head movement   Context: not with bowel movement, not with ear pain, not with eye movement and not with loss of consciousness   Relieved by:  Nothing Worsened by:  Movement Associated symptoms: nausea   Associated symptoms: no diarrhea, no headaches, no hearing loss, no palpitations, no syncope, no tinnitus, no vision changes, no vomiting and no weakness   Risk factors: new medications     Past Medical History  Diagnosis Date  . Gestational diabetes    Past Surgical History  Procedure Laterality Date  . Cervical biopsy  w/ loop electrode excision      2001  . Wisdom tooth extraction    . Cesarean section  07/26/2012    Procedure: CESAREAN SECTION;  Surgeon: Serita Kyle, MD;  Location: [facility];  Service: Obstetrics;  Laterality: N/A;  Primary Cesarean Section Delivery Baby Girl @  [phone], Apgars 8/9    Family History  Problem Relation Age of Onset  . Rheum arthritis Mother   . Melanoma Mother   . COPD Father   . Peripheral vascular disease Father   . Deep vein thrombosis Father   . Hypertension Father   . Hepatitis C Father    Social History  Substance Use Topics  . Smoking status: Never Smoker   . Smokeless  tobacco: Never Used  . Alcohol Use: Yes   OB History    Gravida Para Term Preterm AB TAB SAB Ectopic Multiple Living   0 0 0 0 0 0 1     Review of Systems  HENT: Negative for hearing loss and tinnitus.   Cardiovascular: Negative for palpitations and syncope.  Gastrointestinal: Positive for nausea. Negative for vomiting and diarrhea.  Neurological: Positive for dizziness. Negative for weakness and headaches.  All other systems reviewed and are negative.   Allergies  Ceclor; Latex; and Sulfa antibiotics  Home Medications   Prior to Admission medications   Medication Sig Start Date End Date Taking? Authorizing Provider  norgestimate-ethinyl estradiol (ORTHO-CYCLEN,SPRINTEC,PREVIFEM) 0.25-35 MG-MCG tablet Take 1 tablet by mouth daily.   Yes Historical Provider, MD   Meds Ordered and Administered this Visit   Medications  metoCLOPramide (REGLAN) injection 5 mg (5 mg Intramuscular Given 10/18/15 2002)    Temp(Src) 98 F (36.7 C) (Oral)  Resp 16  Ht  (1.676 m)  Wt 175 lb (79.379 kg)  BMI 28.26 kg/m2  SpO2 100%  LMP 10/07/2015 Orthostatic VS for the past 24 hrs:  BP- Lying Pulse- Lying BP- Sitting Pulse- Sitting BP- Standing at 0 minutes Pulse- Standing at 0 minutes  10/18/15 1917 126/78 mmHg (!) 49 126/79 mmHg 56 127/84 mmHg 54    Physical Exam Nursing  notes and Vital Signs reviewed. Appearance:  Patient appears stated age, and in no acute distress Eyes:  Pupils are equal, round, and reactive to light and accomodation.  Extraocular movement is intact.  Conjunctivae are not inflamed.  Fundi benign.  No nystagmus  Ears:  Canals normal.  Tympanic membranes normal.  Nose:  Mildly congested turbinates.  No sinus tenderness.   Pharynx:  Normal Neck:  Supple.  No adenopathy Lungs:  Clear to auscultation.  Breath sounds are equal.  Moving air well. Heart:  Regular rate and rhythm without murmurs, rubs, or gallops.  Abdomen:  Nontender without masses or  hepatosplenomegaly.  Bowel sounds are present.  No CVA or flank tenderness.  Extremities:  No edema.  Skin:  No rash present.   Neurologic:  Cranial nerves 2 through 12 are normal.  Patellar, achilles, and elbow reflexes are normal.  Cerebellar function is intact (finger-to-nose and rapid alternating hand movement).  Gait and station are normal.    ED Course  Procedures  None    Labs Reviewed  POCT URINE PREGNANCY negative      MDM   1. Benign paroxysmal positional vertigo, right    Reglan  IM Begin clear liquids for 12 hours, then gradually advance diet to a [employer] (Bananas, Rice, Applesauce, Toast) Followup with Family Doctor if not improved in about 4 days.    Lattie Haw, MD 10/18/15 [phone]

## 2015-10-18 NOTE — Discharge Instructions (Signed)
Begin clear liquids for 12 hours, then gradually advance diet to a [employer] (Bananas, Rice, Applesauce, Toast)   Benign Positional Vertigo Vertigo is the feeling that you or your surroundings are moving when they are not. Benign positional vertigo is the most common form of vertigo. The cause of this condition is not serious (is benign). This condition is triggered by certain movements and positions (is positional). This condition can be dangerous if it occurs while you are doing something that could endanger you or others, such as driving.  CAUSES In many cases, the cause of this condition is not known. It may be caused by a disturbance in an area of the inner ear that helps your brain to sense movement and balance. This disturbance can be caused by a viral infection (labyrinthitis), head injury, or repetitive motion. RISK FACTORS This condition is more likely to develop in:  Women.  People who are 5 years of age or older. SYMPTOMS Symptoms of this condition usually happen when you move your head or your eyes in different directions. Symptoms may start suddenly, and they usually last for less than a minute. Symptoms may include:  Loss of balance and falling.  Feeling like you are spinning or moving.  Feeling like your surroundings are spinning or moving.  Nausea and vomiting.  Blurred vision.  Dizziness.  Involuntary eye movement (nystagmus). Symptoms can be mild and cause only slight annoyance, or they can be severe and interfere with daily life. Episodes of benign positional vertigo may return (recur) over time, and they may be triggered by certain movements. Symptoms may improve over time. DIAGNOSIS This condition is usually diagnosed by medical history and a physical exam of the head, neck, and ears. You may be referred to a health care provider who specializes in ear, nose, and throat (ENT) problems (otolaryngologist) or a provider who specializes in disorders of the nervous  system (neurologist). You may have additional testing, including:  MRI.  A CT scan.  Eye movement tests. Your health care provider may ask you to change positions quickly while he or she watches you for symptoms of benign positional vertigo, such as nystagmus. Eye movement may be tested with an electronystagmogram (ENG), caloric stimulation, the Dix-Hallpike test, or the roll test.  An electroencephalogram (EEG). This records electrical activity in your brain.  Hearing tests. TREATMENT Usually, your health care provider will treat this by moving your head in specific positions to adjust your inner ear back to normal. Surgery may be needed in severe cases, but this is rare. In some cases, benign positional vertigo may resolve on its own in 2-4 weeks. HOME CARE INSTRUCTIONS Safety  Move slowly.Avoid sudden body or head movements.  Avoid driving.  Avoid operating heavy machinery.  Avoid doing any tasks that would be dangerous to you or others if a vertigo episode would occur.  If you have trouble walking or keeping your balance, try using a cane for stability. If you feel dizzy or unstable, sit down right away.  Return to your normal activities as told by your health care provider. Ask your health care provider what activities are safe for you. General Instructions  Take over-the-counter and prescription medicines only as told by your health care provider.  Avoid certain positions or movements as told by your health care provider.  Drink enough fluid to keep your urine clear or pale yellow.  Keep all follow-up visits as told by your health care provider. This is important. SEEK MEDICAL CARE IF:  You have a fever.  Your condition gets worse or you develop new symptoms.  Your family or friends notice any behavioral changes.  Your nausea or vomiting gets worse.  You have numbness or a "pins and needles" sensation. SEEK IMMEDIATE MEDICAL CARE IF:  You have difficulty  speaking or moving.  You are always dizzy.  You faint.  You develop severe headaches.  You have weakness in your legs or arms.  You have changes in your hearing or vision.  You develop a stiff neck.  You develop sensitivity to light.   This information is not intended to replace advice given to you by your health care provider. Make sure you discuss any questions you have with your health care provider.   Document Released: 05/19/2006 Document Revised: 05/02/2015 Document Reviewed: 12/04/2014 Elsevier Interactive Patient Education [employer]

## 2016-09-30 DIAGNOSIS — Z3201 Encounter for pregnancy test, result positive: Secondary | ICD-10-CM | POA: Diagnosis not present

## 2016-10-15 DIAGNOSIS — Z3201 Encounter for pregnancy test, result positive: Secondary | ICD-10-CM | POA: Diagnosis not present

## 2016-10-20 DIAGNOSIS — O09521 Supervision of elderly multigravida, first trimester: Secondary | ICD-10-CM | POA: Diagnosis not present

## 2016-10-20 DIAGNOSIS — Z3A01 Less than 8 weeks gestation of pregnancy: Secondary | ICD-10-CM | POA: Diagnosis not present

## 2016-10-20 LAB — OB RESULTS CONSOLE GC/CHLAMYDIA
Chlamydia: NEGATIVE
Gonorrhea: NEGATIVE

## 2016-10-20 LAB — OB RESULTS CONSOLE HIV ANTIBODY (ROUTINE TESTING): HIV: NONREACTIVE

## 2016-10-20 LAB — OB RESULTS CONSOLE RUBELLA ANTIBODY, IGM: RUBELLA: IMMUNE

## 2016-11-05 DIAGNOSIS — Z113 Encounter for screening for infections with a predominantly sexual mode of transmission: Secondary | ICD-10-CM | POA: Diagnosis not present

## 2016-11-05 DIAGNOSIS — Z3689 Encounter for other specified antenatal screening: Secondary | ICD-10-CM | POA: Diagnosis not present

## 2016-11-05 DIAGNOSIS — Z36 Encounter for antenatal screening for chromosomal anomalies: Secondary | ICD-10-CM | POA: Diagnosis not present

## 2016-11-05 DIAGNOSIS — O09521 Supervision of elderly multigravida, first trimester: Secondary | ICD-10-CM | POA: Diagnosis not present

## 2016-11-05 DIAGNOSIS — Z98891 History of uterine scar from previous surgery: Secondary | ICD-10-CM | POA: Diagnosis not present

## 2016-11-05 LAB — HM PAP SMEAR: HM PAP: NEGATIVE

## 2016-12-01 DIAGNOSIS — Z3A14 14 weeks gestation of pregnancy: Secondary | ICD-10-CM | POA: Diagnosis not present

## 2016-12-01 DIAGNOSIS — O09522 Supervision of elderly multigravida, second trimester: Secondary | ICD-10-CM | POA: Diagnosis not present

## 2016-12-15 DIAGNOSIS — Z361 Encounter for antenatal screening for raised alphafetoprotein level: Secondary | ICD-10-CM | POA: Diagnosis not present

## 2016-12-23 ENCOUNTER — Other Ambulatory Visit (HOSPITAL_COMMUNITY): Payer: Self-pay | Admitting: Obstetrics and Gynecology

## 2016-12-23 DIAGNOSIS — R772 Abnormality of alphafetoprotein: Secondary | ICD-10-CM

## 2016-12-23 DIAGNOSIS — Z3689 Encounter for other specified antenatal screening: Secondary | ICD-10-CM

## 2016-12-23 DIAGNOSIS — Z3A19 19 weeks gestation of pregnancy: Secondary | ICD-10-CM

## 2017-01-05 DIAGNOSIS — K080 Exfoliation of teeth due to systemic causes: Secondary | ICD-10-CM | POA: Diagnosis not present

## 2017-01-06 ENCOUNTER — Encounter (HOSPITAL_COMMUNITY): Payer: Self-pay | Admitting: *Deleted

## 2017-01-07 ENCOUNTER — Ambulatory Visit (HOSPITAL_COMMUNITY)
Admission: RE | Admit: 2017-01-07 | Discharge: 2017-01-07 | Disposition: A | Discharge status: Home / Self Care | Payer: Federal, State, Local not specified - PPO | Source: Ambulatory Visit | Attending: Obstetrics and Gynecology | Admitting: Obstetrics and Gynecology

## 2017-01-07 ENCOUNTER — Encounter (HOSPITAL_COMMUNITY): Payer: Self-pay

## 2017-01-07 DIAGNOSIS — O24410 Gestational diabetes mellitus in pregnancy, diet controlled: Secondary | ICD-10-CM | POA: Diagnosis not present

## 2017-01-07 DIAGNOSIS — Z3A19 19 weeks gestation of pregnancy: Secondary | ICD-10-CM | POA: Diagnosis not present

## 2017-01-07 DIAGNOSIS — O09522 Supervision of elderly multigravida, second trimester: Secondary | ICD-10-CM | POA: Insufficient documentation

## 2017-01-07 DIAGNOSIS — O288 Other abnormal findings on antenatal screening of mother: Secondary | ICD-10-CM | POA: Diagnosis not present

## 2017-01-07 DIAGNOSIS — Z361 Encounter for antenatal screening for raised alphafetoprotein level: Secondary | ICD-10-CM | POA: Diagnosis not present

## 2017-01-07 DIAGNOSIS — Z3689 Encounter for other specified antenatal screening: Secondary | ICD-10-CM | POA: Diagnosis not present

## 2017-01-07 DIAGNOSIS — R772 Abnormality of alphafetoprotein: Secondary | ICD-10-CM

## 2017-01-07 DIAGNOSIS — O289 Unspecified abnormal findings on antenatal screening of mother: Secondary | ICD-10-CM

## 2017-01-07 NOTE — Progress Notes (Signed)
Genetic Counseling  Visit Summary Note  Appointment Date: 01/07/2017 Referred By: Maxie Better  Date of Birth: 03/26/1981  Pregnancy history: G2P1001 Estimated Date of Delivery: 06/01/17 Estimated Gestational Age: [redacted]w[redacted]d  [name] [name] and her husband, [name] [name], were seen for genetic counseling because of a maternal age of 36 and an elevated MSAFP.     In summary:  Discussed increased risk for fetal aneuploidy with advanced maternal age  Reviewed results of patient's NIPS and ultrasound  Reviewed MSAFP results  Elevated at 2.53 MoM  Discussed limitations of screening and option of diagnostic testing  Declined amniocentesis  Reviewed family history concerns - none reported  Discussed carrier screening options - declined  CF  SMA  Hemoglobinopathies  They were counseled regarding maternal age and the association with risk for chromosome conditions due to nondisjunction.   We reviewed chromosomes, nondisjunction, and the associated 1 in 125 risk for fetal aneuploidy related to a maternal age of 36 y.o. at [redacted]w[redacted]d gestation.  They were counseled that the risk for aneuploidy decreases as gestational age increases, accounting for those pregnancies which spontaneously abort.  We specifically discussed Down syndrome (trisomy 65), trisomies 68 and 87, and sex chromosome aneuploidies (47,XXX and 47,XXY) including the common features and prognoses of each.   We also reviewed the results of [name] [name]'s non-invasive prenatal screening (NIPS) and ultrasound.  We discussed that NIPS analyzes placental cell free DNA in maternal circulation to evaluate for the presence of extra chromosome conditions.  Thus, it is able to provide risk assessment for specific chromosome conditions, but is not diagnostic.  Specifically, [name] [name] had [place].  Based on this test, the chance for her baby to have aneuploidy for chromosomes X, 13, 18 or 21 was reduced to less than 1 in 47829.  They were counseled  that 50-80% of fetuses with Down syndrome and up to 90% of fetuses with trisomies 13 and 18, when well visualized, have detectable anomalies or soft markers by ultrasound.  A complete ultrasound was performed today.  The ultrasound report will be sent under separate cover. We discussed that no anomalies or soft markers of fetal aneuploidy were identified at this time, but that the heart anatomy was not well seen.    We reviewed [name] [name]'s maternal serum AFP screening result, the elevation of MSAFP, and the associated 1 in 272 risk for a fetal open neural tube defect.   We reviewed open neural tube defects including: the typical multifactorial etiology and variable prognosis.  In addition, we discussed alternative explanations for an elevated MSAFP including: normal variation, feto-maternal bleeding, abdominal wall defects, kidney differences, oligohydramnios, and placental problems.  We discussed that an unexplained elevation of MSAFP is associated with an increased risk for third trimester complications including: prematurity, low birth weight, and pre-eclampsia.  They were counseled that 80-90% of fetuses with open neural tube defects can be detected by detailed second trimester ultrasound, when well visualized.   We also discussed the availability of diagnostic testing by way of amniocentesis.  We reviewed the risks, benefits and limitations of amniocentesis including the approximate 1 in 300-500 risk for pregnancy complications following amniocentesis.  After reviewing the above results and the available options, [name] [name] expressed that she is not interested in pursuing diagnostic testing.  They understand that ultrasound and NIPS cannot rule out all birth defects or genetic syndromes.   [name] [name] was provided with written information regarding cystic fibrosis (CF), spinal muscular atrophy (SMA) and hemoglobinopathies including the carrier frequency,  availability of carrier screening and prenatal  diagnosis if indicated.  In addition, we discussed that CF and hemoglobinopathies are routinely screened for as part of the [place] newborn screening panel and that SMA will be included as an opt-in to newborn screening in [place] beginning this summer.  After further discussion, she declined screening for CF, SMA and hemoglobinopathies.  Both family histories were reviewed and found to be noncontributory for birth defects, mental retardation, and known genetic conditions. Without further information regarding the provided family history, an accurate genetic risk cannot be calculated. Further genetic counseling is warranted if more information is obtained.  [name] [name] denied exposure to environmental toxins or chemical agents. She denied the use of alcohol, tobacco or street drugs. She denied significant viral illnesses during the course of her pregnancy. Her medical and surgical histories were noncontributory.   I counseled this couple regarding the above risks and available options.  The approximate face-to-face time with the genetic counselor was 35 minutes.  [name] Gemmaaragh Soo Steelman, MS,  Certified Genetic Counselor

## 2017-01-08 ENCOUNTER — Other Ambulatory Visit (HOSPITAL_COMMUNITY): Payer: Self-pay | Admitting: *Deleted

## 2017-01-08 DIAGNOSIS — O09522 Supervision of elderly multigravida, second trimester: Secondary | ICD-10-CM

## 2017-01-09 ENCOUNTER — Encounter (HOSPITAL_COMMUNITY): Payer: Self-pay

## 2017-01-09 ENCOUNTER — Other Ambulatory Visit: Payer: Self-pay

## 2017-01-21 DIAGNOSIS — O24410 Gestational diabetes mellitus in pregnancy, diet controlled: Secondary | ICD-10-CM | POA: Diagnosis not present

## 2017-01-21 DIAGNOSIS — Z3A21 21 weeks gestation of pregnancy: Secondary | ICD-10-CM | POA: Diagnosis not present

## 2017-01-27 DIAGNOSIS — O24410 Gestational diabetes mellitus in pregnancy, diet controlled: Secondary | ICD-10-CM | POA: Diagnosis not present

## 2017-01-27 DIAGNOSIS — O09522 Supervision of elderly multigravida, second trimester: Secondary | ICD-10-CM | POA: Diagnosis not present

## 2017-02-04 ENCOUNTER — Encounter: Payer: Self-pay | Admitting: Registered"

## 2017-02-04 ENCOUNTER — Encounter: Payer: Federal, State, Local not specified - PPO | Attending: Obstetrics and Gynecology | Admitting: Registered"

## 2017-02-04 DIAGNOSIS — Z3A00 Weeks of gestation of pregnancy not specified: Secondary | ICD-10-CM | POA: Insufficient documentation

## 2017-02-04 DIAGNOSIS — O99810 Abnormal glucose complicating pregnancy: Secondary | ICD-10-CM | POA: Insufficient documentation

## 2017-02-04 DIAGNOSIS — R7309 Other abnormal glucose: Secondary | ICD-10-CM | POA: Diagnosis not present

## 2017-02-04 DIAGNOSIS — Z713 Dietary counseling and surveillance: Secondary | ICD-10-CM | POA: Diagnosis not present

## 2017-02-04 NOTE — Progress Notes (Signed)
Patient was seen on 02/04/2017 for Gestational Diabetes self-management class at the [facility]. The following learning objectives were met by the patient during this course:   States the definition of Gestational Diabetes  States why dietary management is important in controlling blood glucose  Describes the effects each nutrient has on blood glucose levels  Demonstrates ability to create a balanced meal plan  Demonstrates carbohydrate counting   States when to check blood glucose levels  Demonstrates proper blood glucose monitoring techniques  States the effect of stress and exercise on blood glucose levels  States the importance of limiting caffeine and abstaining from alcohol and smoking  Blood glucose monitor given: none given Lot # n/a Exp: n/a Blood glucose reading: n/a  Patient instructed to monitor glucose levels: FBS: 60 - <90 1 hour: <140 2 hour: <120  Patient received handouts:  Nutrition Diabetes and Pregnancy  Carbohydrate Counting List  Patient will be seen for follow-up as needed. 

## 2017-02-05 DIAGNOSIS — O09522 Supervision of elderly multigravida, second trimester: Secondary | ICD-10-CM | POA: Diagnosis not present

## 2017-02-05 DIAGNOSIS — O24410 Gestational diabetes mellitus in pregnancy, diet controlled: Secondary | ICD-10-CM | POA: Diagnosis not present

## 2017-02-05 DIAGNOSIS — Z3A23 23 weeks gestation of pregnancy: Secondary | ICD-10-CM | POA: Diagnosis not present

## 2017-02-09 ENCOUNTER — Ambulatory Visit (HOSPITAL_COMMUNITY)
Admission: RE | Admit: 2017-02-09 | Discharge: 2017-02-09 | Disposition: A | Discharge status: Home / Self Care | Payer: Federal, State, Local not specified - PPO | Source: Ambulatory Visit | Attending: Obstetrics and Gynecology | Admitting: Obstetrics and Gynecology

## 2017-02-09 ENCOUNTER — Encounter (HOSPITAL_COMMUNITY): Payer: Self-pay

## 2017-02-09 DIAGNOSIS — Z3A24 24 weeks gestation of pregnancy: Secondary | ICD-10-CM | POA: Insufficient documentation

## 2017-02-09 DIAGNOSIS — O24410 Gestational diabetes mellitus in pregnancy, diet controlled: Secondary | ICD-10-CM | POA: Diagnosis not present

## 2017-02-09 DIAGNOSIS — O280 Abnormal hematological finding on antenatal screening of mother: Secondary | ICD-10-CM | POA: Diagnosis not present

## 2017-02-09 DIAGNOSIS — Z362 Encounter for other antenatal screening follow-up: Secondary | ICD-10-CM | POA: Diagnosis not present

## 2017-02-09 DIAGNOSIS — O09522 Supervision of elderly multigravida, second trimester: Secondary | ICD-10-CM | POA: Diagnosis not present

## 2017-02-09 DIAGNOSIS — O321XX0 Maternal care for breech presentation, not applicable or unspecified: Secondary | ICD-10-CM | POA: Diagnosis not present

## 2017-03-06 DIAGNOSIS — Z3A27 27 weeks gestation of pregnancy: Secondary | ICD-10-CM | POA: Diagnosis not present

## 2017-03-06 DIAGNOSIS — O09522 Supervision of elderly multigravida, second trimester: Secondary | ICD-10-CM | POA: Diagnosis not present

## 2017-03-06 DIAGNOSIS — O24410 Gestational diabetes mellitus in pregnancy, diet controlled: Secondary | ICD-10-CM | POA: Diagnosis not present

## 2017-03-20 DIAGNOSIS — O09522 Supervision of elderly multigravida, second trimester: Secondary | ICD-10-CM | POA: Diagnosis not present

## 2017-03-20 DIAGNOSIS — Z3689 Encounter for other specified antenatal screening: Secondary | ICD-10-CM | POA: Diagnosis not present

## 2017-03-20 DIAGNOSIS — Z3A29 29 weeks gestation of pregnancy: Secondary | ICD-10-CM | POA: Diagnosis not present

## 2017-03-31 DIAGNOSIS — Z3A31 31 weeks gestation of pregnancy: Secondary | ICD-10-CM | POA: Diagnosis not present

## 2017-03-31 DIAGNOSIS — Z23 Encounter for immunization: Secondary | ICD-10-CM | POA: Diagnosis not present

## 2017-03-31 DIAGNOSIS — O09522 Supervision of elderly multigravida, second trimester: Secondary | ICD-10-CM | POA: Diagnosis not present

## 2017-04-16 DIAGNOSIS — Z3A33 33 weeks gestation of pregnancy: Secondary | ICD-10-CM | POA: Diagnosis not present

## 2017-04-16 DIAGNOSIS — O09523 Supervision of elderly multigravida, third trimester: Secondary | ICD-10-CM | POA: Diagnosis not present

## 2017-05-01 DIAGNOSIS — Z3A35 35 weeks gestation of pregnancy: Secondary | ICD-10-CM | POA: Diagnosis not present

## 2017-05-01 DIAGNOSIS — Z3685 Encounter for antenatal screening for Streptococcus B: Secondary | ICD-10-CM | POA: Diagnosis not present

## 2017-05-01 DIAGNOSIS — O09523 Supervision of elderly multigravida, third trimester: Secondary | ICD-10-CM | POA: Diagnosis not present

## 2017-05-01 LAB — OB RESULTS CONSOLE GBS: STREP GROUP B AG: NEGATIVE

## 2017-05-07 DIAGNOSIS — Z3A36 36 weeks gestation of pregnancy: Secondary | ICD-10-CM | POA: Diagnosis not present

## 2017-05-07 DIAGNOSIS — O24410 Gestational diabetes mellitus in pregnancy, diet controlled: Secondary | ICD-10-CM | POA: Diagnosis not present

## 2017-05-15 DIAGNOSIS — Z3A37 37 weeks gestation of pregnancy: Secondary | ICD-10-CM | POA: Diagnosis not present

## 2017-05-15 DIAGNOSIS — O24410 Gestational diabetes mellitus in pregnancy, diet controlled: Secondary | ICD-10-CM | POA: Diagnosis not present

## 2017-05-20 DIAGNOSIS — Z3A38 38 weeks gestation of pregnancy: Secondary | ICD-10-CM | POA: Diagnosis not present

## 2017-05-20 DIAGNOSIS — O24410 Gestational diabetes mellitus in pregnancy, diet controlled: Secondary | ICD-10-CM | POA: Diagnosis not present

## 2017-05-23 ENCOUNTER — Encounter (HOSPITAL_COMMUNITY)
Admission: AD | Disposition: A | Discharge status: Home / Self Care | Payer: Self-pay | Source: Ambulatory Visit | Attending: Obstetrics and Gynecology

## 2017-05-23 ENCOUNTER — Inpatient Hospital Stay (HOSPITAL_COMMUNITY)
Admission: AD | Admit: 2017-05-23 | Discharge: 2017-05-26 | DRG: 787 | Disposition: A | Discharge status: Home / Self Care | Payer: Federal, State, Local not specified - PPO | Source: Ambulatory Visit | Attending: Obstetrics and Gynecology | Admitting: Obstetrics and Gynecology

## 2017-05-23 ENCOUNTER — Inpatient Hospital Stay (HOSPITAL_COMMUNITY): Payer: Federal, State, Local not specified - PPO | Admitting: Anesthesiology

## 2017-05-23 ENCOUNTER — Encounter (HOSPITAL_COMMUNITY): Payer: Self-pay | Admitting: *Deleted

## 2017-05-23 DIAGNOSIS — O403XX0 Polyhydramnios, third trimester, not applicable or unspecified: Secondary | ICD-10-CM | POA: Diagnosis present

## 2017-05-23 DIAGNOSIS — D62 Acute posthemorrhagic anemia: Secondary | ICD-10-CM

## 2017-05-23 DIAGNOSIS — Q250 Patent ductus arteriosus: Secondary | ICD-10-CM | POA: Diagnosis not present

## 2017-05-23 DIAGNOSIS — O34211 Maternal care for low transverse scar from previous cesarean delivery: Secondary | ICD-10-CM | POA: Diagnosis present

## 2017-05-23 DIAGNOSIS — Z3A00 Weeks of gestation of pregnancy not specified: Secondary | ICD-10-CM | POA: Diagnosis not present

## 2017-05-23 DIAGNOSIS — Z23 Encounter for immunization: Secondary | ICD-10-CM

## 2017-05-23 DIAGNOSIS — Q909 Down syndrome, unspecified: Secondary | ICD-10-CM | POA: Diagnosis not present

## 2017-05-23 DIAGNOSIS — O324XX0 Maternal care for high head at term, not applicable or unspecified: Secondary | ICD-10-CM | POA: Diagnosis present

## 2017-05-23 DIAGNOSIS — Z3A38 38 weeks gestation of pregnancy: Secondary | ICD-10-CM

## 2017-05-23 DIAGNOSIS — O9081 Anemia of the puerperium: Secondary | ICD-10-CM | POA: Diagnosis not present

## 2017-05-23 DIAGNOSIS — O24420 Gestational diabetes mellitus in childbirth, diet controlled: Secondary | ICD-10-CM | POA: Diagnosis not present

## 2017-05-23 DIAGNOSIS — Q211 Atrial septal defect: Secondary | ICD-10-CM | POA: Diagnosis not present

## 2017-05-23 LAB — TYPE AND SCREEN
ABO/RH(D): B POS
Antibody Screen: NEGATIVE

## 2017-05-23 LAB — CBC
HCT: 38.1 % (ref 36.0–46.0)
HEMOGLOBIN: 12.9 g/dL (ref 12.0–15.0)
MCH: 27.9 pg (ref 26.0–34.0)
MCHC: 33.9 g/dL (ref 30.0–36.0)
MCV: 82.3 fL (ref 78.0–100.0)
PLATELETS: 215 10*3/uL (ref 150–400)
RBC: 4.63 MIL/uL (ref 3.87–5.11)
RDW: 14.1 % (ref 11.5–15.5)
WBC: 15.4 10*3/uL — AB (ref 4.0–10.5)

## 2017-05-23 LAB — GLUCOSE, CAPILLARY
GLUCOSE-CAPILLARY: 82 mg/dL (ref 65–99)
GLUCOSE-CAPILLARY: 79 mg/dL (ref 65–99)
Glucose-Capillary: 93 mg/dL (ref 65–99)

## 2017-05-23 LAB — ABO/RH: ABO/RH(D): B POS

## 2017-05-23 SURGERY — Surgical Case
Anesthesia: Epidural

## 2017-05-23 MED ORDER — FENTANYL 2.5 MCG/ML BUPIVACAINE 1/10 % EPIDURAL INFUSION (WH - ANES)
14.0000 mL/h | INTRAMUSCULAR | Status: DC | PRN
  Administered 2017-05-23 (×2): 14 mL/h via EPIDURAL
  Filled 2017-05-23: qty 100

## 2017-05-23 MED ORDER — EPHEDRINE 5 MG/ML INJ
INTRAVENOUS | Status: AC
  Filled 2017-05-23: qty 4

## 2017-05-23 MED ORDER — SOD CITRATE-CITRIC ACID 500-334 MG/5ML PO SOLN
30.0000 mL | ORAL | Status: DC | PRN
  Administered 2017-05-23: 30 mL via ORAL
  Filled 2017-05-23: qty 15

## 2017-05-23 MED ORDER — DIPHENHYDRAMINE HCL 25 MG PO CAPS
25.0000 mg | ORAL_CAPSULE | Freq: Four times a day (QID) | ORAL | Status: DC | PRN
  Administered 2017-05-23: 25 mg via ORAL
  Filled 2017-05-23: qty 1

## 2017-05-23 MED ORDER — TERBUTALINE SULFATE 1 MG/ML IJ SOLN
0.2500 mg | Freq: Once | INTRAMUSCULAR | Status: AC | PRN
  Administered 2017-05-23: 0.25 mg via SUBCUTANEOUS
  Filled 2017-05-23: qty 1

## 2017-05-23 MED ORDER — FENTANYL CITRATE (PF) 100 MCG/2ML IJ SOLN: 25.0000 ug | INTRAMUSCULAR | Status: DC | PRN

## 2017-05-23 MED ORDER — WITCH HAZEL-GLYCERIN EX PADS: 1.0000 "application " | MEDICATED_PAD | CUTANEOUS | Status: DC | PRN

## 2017-05-23 MED ORDER — PRENATAL MULTIVITAMIN CH
1.0000 | ORAL_TABLET | Freq: Every day | ORAL | Status: DC
  Administered 2017-05-24 – 2017-05-26 (×3): 1 via ORAL
  Filled 2017-05-23 (×3): qty 1

## 2017-05-23 MED ORDER — PHENYLEPHRINE 40 MCG/ML (10ML) SYRINGE FOR IV PUSH (FOR BLOOD PRESSURE SUPPORT)
PREFILLED_SYRINGE | INTRAVENOUS | Status: AC
  Filled 2017-05-23: qty 10

## 2017-05-23 MED ORDER — FENTANYL CITRATE (PF) 100 MCG/2ML IJ SOLN
INTRAMUSCULAR | Status: AC
  Filled 2017-05-23: qty 2

## 2017-05-23 MED ORDER — EPHEDRINE 5 MG/ML INJ: 10.0000 mg | INTRAVENOUS | Status: DC | PRN

## 2017-05-23 MED ORDER — BUPIVACAINE HCL (PF) 0.25 % IJ SOLN
INTRAMUSCULAR | Status: DC | PRN
  Administered 2017-05-23: 10 mL

## 2017-05-23 MED ORDER — PHENYLEPHRINE 40 MCG/ML (10ML) SYRINGE FOR IV PUSH (FOR BLOOD PRESSURE SUPPORT): 80.0000 ug | PREFILLED_SYRINGE | INTRAVENOUS | Status: DC | PRN

## 2017-05-23 MED ORDER — MEPERIDINE HCL 25 MG/ML IJ SOLN: 6.2500 mg | INTRAMUSCULAR | Status: DC | PRN

## 2017-05-23 MED ORDER — ONDANSETRON HCL 4 MG/2ML IJ SOLN
INTRAMUSCULAR | Status: AC
  Filled 2017-05-23: qty 2

## 2017-05-23 MED ORDER — SIMETHICONE 80 MG PO CHEW
80.0000 mg | CHEWABLE_TABLET | Freq: Three times a day (TID) | ORAL | Status: DC
  Administered 2017-05-24 – 2017-05-26 (×6): 80 mg via ORAL
  Filled 2017-05-23 (×6): qty 1

## 2017-05-23 MED ORDER — LACTATED RINGERS IV SOLN: INTRAVENOUS | Status: DC

## 2017-05-23 MED ORDER — OXYTOCIN 40 UNITS IN LACTATED RINGERS INFUSION - SIMPLE MED: 2.5000 [IU]/h | INTRAVENOUS | Status: AC

## 2017-05-23 MED ORDER — OXYCODONE-ACETAMINOPHEN 5-325 MG PO TABS: 1.0000 | ORAL_TABLET | ORAL | Status: DC | PRN

## 2017-05-23 MED ORDER — SCOPOLAMINE 1 MG/3DAYS TD PT72
MEDICATED_PATCH | TRANSDERMAL | Status: DC | PRN
  Administered 2017-05-23: 1 via TRANSDERMAL

## 2017-05-23 MED ORDER — SODIUM BICARBONATE 8.4 % IV SOLN
INTRAVENOUS | Status: DC | PRN
  Administered 2017-05-23: 10 mL via EPIDURAL

## 2017-05-23 MED ORDER — LACTATED RINGERS IV SOLN: 500.0000 mL | INTRAVENOUS | Status: DC | PRN

## 2017-05-23 MED ORDER — LIDOCAINE HCL (PF) 1 % IJ SOLN
30.0000 mL | INTRAMUSCULAR | Status: DC | PRN
Start: 2017-05-23 — End: 2017-05-23
  Filled 2017-05-23: qty 30

## 2017-05-23 MED ORDER — ONDANSETRON HCL 4 MG/2ML IJ SOLN: 4.0000 mg | Freq: Four times a day (QID) | INTRAMUSCULAR | Status: DC | PRN

## 2017-05-23 MED ORDER — FENTANYL 2.5 MCG/ML BUPIVACAINE 1/10 % EPIDURAL INFUSION (WH - ANES)
INTRAMUSCULAR | Status: AC
  Filled 2017-05-23: qty 100

## 2017-05-23 MED ORDER — DEXAMETHASONE SODIUM PHOSPHATE 4 MG/ML IJ SOLN
INTRAMUSCULAR | Status: DC | PRN
  Administered 2017-05-23: 4 mg via INTRAVENOUS

## 2017-05-23 MED ORDER — COCONUT OIL OIL
1.0000 "application " | TOPICAL_OIL | Status: DC | PRN
  Filled 2017-05-23: qty 120

## 2017-05-23 MED ORDER — DIPHENHYDRAMINE HCL 50 MG/ML IJ SOLN: 12.5000 mg | INTRAMUSCULAR | Status: DC | PRN

## 2017-05-23 MED ORDER — OXYTOCIN 40 UNITS IN LACTATED RINGERS INFUSION - SIMPLE MED
1.0000 m[IU]/min | INTRAVENOUS | Status: DC
  Administered 2017-05-23: 2 m[IU]/min via INTRAVENOUS
  Filled 2017-05-23: qty 1000

## 2017-05-23 MED ORDER — PHENYLEPHRINE HCL 10 MG/ML IJ SOLN
INTRAMUSCULAR | Status: DC | PRN
  Administered 2017-05-23 (×2): 80 ug via INTRAVENOUS

## 2017-05-23 MED ORDER — ACETAMINOPHEN 325 MG PO TABS: 650.0000 mg | ORAL_TABLET | ORAL | Status: DC | PRN

## 2017-05-23 MED ORDER — OXYTOCIN 10 UNIT/ML IJ SOLN
INTRAVENOUS | Status: DC | PRN
  Administered 2017-05-23: 40 [IU] via INTRAVENOUS

## 2017-05-23 MED ORDER — LACTATED RINGERS IV SOLN
INTRAVENOUS | Status: DC | PRN
  Administered 2017-05-23 (×3): via INTRAVENOUS

## 2017-05-23 MED ORDER — OXYTOCIN 10 UNIT/ML IJ SOLN: 10.0000 [IU] | Freq: Once | INTRAMUSCULAR | Status: DC

## 2017-05-23 MED ORDER — OXYTOCIN 10 UNIT/ML IJ SOLN
INTRAMUSCULAR | Status: AC
  Filled 2017-05-23: qty 4

## 2017-05-23 MED ORDER — BUPIVACAINE HCL (PF) 0.25 % IJ SOLN
INTRAMUSCULAR | Status: AC
  Filled 2017-05-23: qty 10

## 2017-05-23 MED ORDER — DEXAMETHASONE SODIUM PHOSPHATE 4 MG/ML IJ SOLN
INTRAMUSCULAR | Status: AC
  Filled 2017-05-23: qty 1

## 2017-05-23 MED ORDER — SODIUM CHLORIDE 0.9 % IJ SOLN
INTRAMUSCULAR | Status: AC
  Filled 2017-05-23: qty 20

## 2017-05-23 MED ORDER — OXYTOCIN BOLUS FROM INFUSION: 500.0000 mL | Freq: Once | INTRAVENOUS | Status: DC

## 2017-05-23 MED ORDER — LACTATED RINGERS IV SOLN: 500.0000 mL | Freq: Once | INTRAVENOUS | Status: DC

## 2017-05-23 MED ORDER — PANTOPRAZOLE SODIUM 40 MG IV SOLR
40.0000 mg | Freq: Once | INTRAVENOUS | Status: AC
  Administered 2017-05-23: 40 mg via INTRAVENOUS
  Filled 2017-05-23: qty 40

## 2017-05-23 MED ORDER — MENTHOL 3 MG MT LOZG: 1.0000 | LOZENGE | OROMUCOSAL | Status: DC | PRN

## 2017-05-23 MED ORDER — METOCLOPRAMIDE HCL 5 MG/ML IJ SOLN: 10.0000 mg | Freq: Once | INTRAMUSCULAR | Status: DC | PRN

## 2017-05-23 MED ORDER — LIDOCAINE HCL (PF) 1 % IJ SOLN
INTRAMUSCULAR | Status: DC | PRN
  Administered 2017-05-23 (×2): 5 mL

## 2017-05-23 MED ORDER — CEFAZOLIN SODIUM-DEXTROSE 2-3 GM-% IV SOLR
INTRAVENOUS | Status: DC | PRN
  Administered 2017-05-23: 2 g via INTRAVENOUS

## 2017-05-23 MED ORDER — OXYCODONE-ACETAMINOPHEN 5-325 MG PO TABS: 2.0000 | ORAL_TABLET | ORAL | Status: DC | PRN

## 2017-05-23 MED ORDER — ZOLPIDEM TARTRATE 5 MG PO TABS: 5.0000 mg | ORAL_TABLET | Freq: Every evening | ORAL | Status: DC | PRN

## 2017-05-23 MED ORDER — OXYTOCIN 40 UNITS IN LACTATED RINGERS INFUSION - SIMPLE MED: 2.5000 [IU]/h | INTRAVENOUS | Status: DC

## 2017-05-23 MED ORDER — LACTATED RINGERS IV SOLN
INTRAVENOUS | Status: DC
  Administered 2017-05-23: 10:00:00 via INTRAVENOUS

## 2017-05-23 MED ORDER — MORPHINE SULFATE (PF) 0.5 MG/ML IJ SOLN
INTRAMUSCULAR | Status: DC | PRN
  Administered 2017-05-23: 4 mg via EPIDURAL

## 2017-05-23 MED ORDER — MEPERIDINE HCL 25 MG/ML IJ SOLN
INTRAMUSCULAR | Status: DC | PRN
  Administered 2017-05-23 (×2): 12.5 mg via INTRAVENOUS

## 2017-05-23 MED ORDER — SODIUM CHLORIDE 0.9 % IR SOLN
Status: DC | PRN
  Administered 2017-05-23: 1000 mL

## 2017-05-23 MED ORDER — SENNOSIDES-DOCUSATE SODIUM 8.6-50 MG PO TABS
2.0000 | ORAL_TABLET | ORAL | Status: DC
  Administered 2017-05-23 – 2017-05-25 (×3): 2 via ORAL
  Filled 2017-05-23 (×3): qty 2

## 2017-05-23 MED ORDER — SIMETHICONE 80 MG PO CHEW
80.0000 mg | CHEWABLE_TABLET | ORAL | Status: DC
  Administered 2017-05-23 – 2017-05-25 (×3): 80 mg via ORAL
  Filled 2017-05-23 (×3): qty 1

## 2017-05-23 MED ORDER — DIBUCAINE 1 % RE OINT: 1.0000 "application " | TOPICAL_OINTMENT | RECTAL | Status: DC | PRN

## 2017-05-23 MED ORDER — LACTATED RINGERS IV SOLN
INTRAVENOUS | Status: DC
  Administered 2017-05-24: via INTRAVENOUS

## 2017-05-23 MED ORDER — PHENYLEPHRINE 40 MCG/ML (10ML) SYRINGE FOR IV PUSH (FOR BLOOD PRESSURE SUPPORT)
PREFILLED_SYRINGE | INTRAVENOUS | Status: AC
  Filled 2017-05-23: qty 20

## 2017-05-23 MED ORDER — MEPERIDINE HCL 25 MG/ML IJ SOLN
INTRAMUSCULAR | Status: AC
  Filled 2017-05-23: qty 1

## 2017-05-23 MED ORDER — MORPHINE SULFATE (PF) 0.5 MG/ML IJ SOLN
INTRAMUSCULAR | Status: AC
  Filled 2017-05-23: qty 10

## 2017-05-23 MED ORDER — IBUPROFEN 600 MG PO TABS
600.0000 mg | ORAL_TABLET | Freq: Four times a day (QID) | ORAL | Status: DC
  Administered 2017-05-23 – 2017-05-26 (×11): 600 mg via ORAL
  Filled 2017-05-23 (×11): qty 1

## 2017-05-23 MED ORDER — SIMETHICONE 80 MG PO CHEW: 80.0000 mg | CHEWABLE_TABLET | ORAL | Status: DC | PRN

## 2017-05-23 MED ORDER — ONDANSETRON HCL 4 MG/2ML IJ SOLN
INTRAMUSCULAR | Status: DC | PRN
  Administered 2017-05-23: 4 mg via INTRAVENOUS

## 2017-05-23 MED ORDER — SCOPOLAMINE 1 MG/3DAYS TD PT72
MEDICATED_PATCH | TRANSDERMAL | Status: AC
  Filled 2017-05-23: qty 1

## 2017-05-23 MED ORDER — OXYCODONE-ACETAMINOPHEN 5-325 MG PO TABS
1.0000 | ORAL_TABLET | ORAL | Status: DC | PRN
Start: 2017-05-23 — End: 2017-05-26

## 2017-05-23 NOTE — Progress Notes (Signed)
S: comfortable  O; BP (!) 112/49   Pulse 70   Temp 98.4 F (36.9 C) (Oral)   Resp 16   Ht  (1.651 m)   Wt 88.9 kg (196 lb)   LMP 08/25/2016   BMI 32.62 kg/m   Pitocin off VE fully +1 ROP asynclitic Tracing: baseline 120 (+) accel early decel Ctx q  IMP: complete Asynclitic Class A1 GM P) right exaggerated sims.

## 2017-05-23 NOTE — MAU Note (Signed)
Mild contractions began yesterday at 4:00pm with no distinguishable pattern.  Overnight they became stronger and she was no longer able to walk and talk through them.  Previous C/S for face presentation.  Desires VBAC if possible.

## 2017-05-23 NOTE — Anesthesia Pain Management Evaluation Note (Addendum)
  CRNA Pain Management Visit Note  Patient: [name] [name], 36 y.o., female  "Hello I am a member of the anesthesia team at [facility]. We have an anesthesia team available at all times to provide care throughout the hospital, including epidural management and anesthesia for C-section. I don't know your plan for the delivery whether it a natural birth, water birth, IV sedation, nitrous supplementation, doula or epidural, but we want to meet your pain goals."   1.Was your pain managed to your expectations on prior hospitalizations?   Yes   2.What is your expectation for pain management during this hospitalization?    epidural  3.How can we help you reach that goal? Patient complete at time of visit and ready to push.   Record the patient's initial score and the patient's pain goal.   Pain:   Pain Goal:  [facility] wants you to be able to say your pain was always managed very well.  [name] 05/23/2017

## 2017-05-23 NOTE — Transfer of Care (Signed)
Immediate Anesthesia Transfer of Care Note  Patient: [name] [name]  Procedure(s) Performed: Procedure(s): CESAREAN SECTION (N/A)  Patient Location: PACU  Anesthesia Type:Epidural  Level of Consciousness: awake, alert  and oriented  Airway & Oxygen Therapy: Patient Spontanous Breathing  Post-op Assessment: Report given to RN and Post -op Vital signs reviewed and stable  Post vital signs: Reviewed and stable  Last Vitals:  Vitals:   05/23/17 1800 05/23/17 1830  BP: (!) 112/49 (!) 103/56  Pulse: 70 67  Resp:    Temp:      Last Pain:  Vitals:   05/23/17 1430  TempSrc:   PainSc: 0-No pain      Patients Stated Pain Goal: 9 (05/23/17 0919)  Complications: No apparent anesthesia complications

## 2017-05-23 NOTE — Progress Notes (Signed)
S: no rectal pressure Pushing  O: BP 118/71   Pulse (!) 55   Temp 98.4 F (36.9 C) (Oral)   Resp 16   Ht  (1.651 m)   Wt 88.9 kg (196 lb)   LMP 08/25/2016   BMI 32.62 kg/m   Pitocin 4 miu VE fully +1/+2 with caput  Tracing; baseline 120 (+) accels  Good variability decel to 90's due to tetanic ctx  Pushed but no descent noted. FHR back to baseline with maternal positional Change, mat O2 Ctx q 2 mins  IMP; complete Prev C/S Class a1 GDM P) stop pushing. Labor vtx down . Decrease pitocin

## 2017-05-23 NOTE — H&P (Signed)
[name] [name] is a 36 y.o. female presenting @ 62 5/[redacted] weeks gestation with Class A1 GDM , previous LTCS in active labor. Intact membrane. GBS cx neg  OB History    Gravida Para Term Preterm AB Living   0 0 1   SAB TAB Ectopic Multiple Live Births   0 0 0 0 1     Past Medical History:  Diagnosis Date  . Gestational diabetes    Past Surgical History:  Procedure Laterality Date  . CERVICAL BIOPSY  W/ LOOP ELECTRODE EXCISION     2001  . CESAREAN SECTION  07/26/2012   Procedure: CESAREAN SECTION;  Surgeon: Serita Kyle, MD;  Location: [facility];  Service: Obstetrics;  Laterality: N/A;  Primary Cesarean Section Delivery Baby Girl @  [phone], Apgars 8/9   . WISDOM TOOTH EXTRACTION     Family History: family history includes COPD in her father; Deep vein thrombosis in her father; Hepatitis C in her father; Hypertension in her father; Melanoma in her mother; Peripheral vascular disease in her father; Rheum arthritis in her mother. Social History:  reports that she has never smoked. She has never used smokeless tobacco. She reports that she drinks alcohol. She reports that she does not use drugs.     Maternal Diabetes: Yes:  Diabetes Type:  Diet controlled Genetic Screening: Abnormal:  Results: Elevated AFP Maternal Ultrasounds/Referrals: Normal Fetal Ultrasounds or other Referrals:  Fetal echo, Referred to Materal Fetal Medicine  nl( done due to early GDM) Maternal Substance Abuse:  No Significant Maternal Medications:  None Significant Maternal Lab Results:  Lab values include: Group B Strep negative Other Comments:  None  Review of Systems  All other systems reviewed and are negative.  History Dilation: 5 Effacement (%): 90 Station: -3 Exam by:: Laurell Josephs RN Blood pressure 129/76, pulse 74, temperature 98.2 F (36.8 C), temperature source Oral, resp. rate 20, height  (1.651 m), weight 88.9 kg (196 lb), last menstrual period 08/25/2016. Exam Physical Exam   Constitutional: She is oriented to person, place, and time.  Eyes: EOM are normal.  Neck: Neck supple.  Cardiovascular: Regular rhythm.   Respiratory: Breath sounds normal.  GI: Soft.  Musculoskeletal: She exhibits edema.  Neurological: She is alert and oriented to person, place, and time.  Skin: Skin is warm and dry.  Psychiatric: She has a normal mood and affect.    Prenatal labs: ABO, Rh:  B positive Antibody:  neg Rubella:  Immune RPR:   NR HBsAg:  neg  HIV:   neg GBS:   neg  Assessment/Plan: Active labor Previous LTCS desires VBAC Class A1 GDM Term Unexplained elev AFP P) admit . Routine labs. CBG q 4 hrs. Epidural. Amniotomy. Pitocin augmentation prn   [name] [name] A 05/23/2017, 9:28 AM

## 2017-05-23 NOTE — MAU Note (Signed)
Urine in lab 

## 2017-05-23 NOTE — MAU Note (Signed)
Notified Dr. Cherly Hensen, patient G2P1 [redacted]w[redacted]d prior C/S desires VBAC, 5/90/-3 bloody show, contractions every 2 to 3 minutes, GBS-, MD to put admit orders.

## 2017-05-23 NOTE — Progress Notes (Signed)
Reexamined. VE unchanged. P) start pushing on right side. Pitocin remained off however with pushing FHR  Decreased to 90's with spontaneous ctx. Given the persistence of decel, will proceed with repeat C/S. Pt agrees. [facility] given. Risk of surgery reviewed including infection, bleeding, injury to bladder, bowel, ureter, internal scar tissue. All ? Answered. Consent signed

## 2017-05-23 NOTE — Anesthesia Procedure Notes (Signed)
Epidural Patient location during procedure: OB  Staffing Anesthesiologist: Dastan Krider Performed: anesthesiologist   Preanesthetic Checklist Completed: patient identified, site marked, surgical consent, pre-op evaluation, timeout performed, IV checked, risks and benefits discussed and monitors and equipment checked  Epidural Patient position: sitting Prep: DuraPrep Patient monitoring: heart rate, continuous pulse ox and blood pressure Approach: right paramedian Location: L3-L4 Injection technique: LOR saline  Needle:  Needle type: Tuohy  Needle gauge: 17 G Needle length: 9 cm and 9 Needle insertion depth: 6 cm Catheter type: closed end flexible Catheter size: 20 Guage Catheter at skin depth: 10 cm Test dose: negative  Assessment Events: blood not aspirated, injection not painful, no injection resistance, negative IV test and no paresthesia  Additional Notes Patient identified. Risks/Benefits/Options discussed with patient including but not limited to bleeding, infection, nerve damage, paralysis, failed block, incomplete pain control, headache, blood pressure changes, nausea, vomiting, reactions to medication both or allergic, itching and postpartum back pain. Confirmed with bedside nurse the patient's most recent platelet count. Confirmed with patient that they are not currently taking any anticoagulation, have any bleeding history or any family history of bleeding disorders. Patient expressed understanding and wished to proceed. All questions were answered. Sterile technique was used throughout the entire procedure. Please see nursing notes for vital signs. Test dose was given through epidural needle and negative prior to continuing to dose epidural or start infusion. Warning signs of high block given to the patient including shortness of breath, tingling/numbness in hands, complete motor block, or any concerning symptoms with instructions to call for help. Patient was given  instructions on fall risk and not to get out of bed. All questions and concerns addressed with instructions to call with any issues.     

## 2017-05-23 NOTE — Progress Notes (Signed)
S; comfortable Notes heartburn  O: BP (!) 111/43   Pulse 65   Temp 98.4 F (36.9 C) (Oral)   Resp 16   Ht  (1.651 m)   Wt 88.9 kg (196 lb)   LMP 08/25/2016   BMI 32.62 kg/m  Epidural VE 6/80/-1 AROM copious clear fluid IUPC/ISE placed  Tracing> baseline 120 (+) accels to 140-145. 2 1/2 mins decel noted after arom with return to baseline Ctx q q2-4 mins Reexamination showed cervix unchanged and no cord palp  CBG (last 3)   Recent Labs  05/23/17 0940 05/23/17 1124  GLUCAP 82 79    IMP: Active phase protracted Previous LTCS  Class A1 GDM Reassuring tracing currently P) observe x 1/2 hr. If remains cat 1, start pitocin. Will need exaggerated sims

## 2017-05-23 NOTE — Anesthesia Preprocedure Evaluation (Signed)
Anesthesia Evaluation  Patient identified by MRN, date of birth, ID band Patient awake    Reviewed: Allergy & Precautions, H&P , NPO status , Patient's Chart, lab work & pertinent test results  History of Anesthesia Complications Negative for: history of anesthetic complications  Airway Mallampati: II  TM Distance: >3 FB Neck ROM: full    Dental no notable dental hx. (+) Teeth Intact   Pulmonary neg pulmonary ROS,    Pulmonary exam normal breath sounds clear to auscultation       Cardiovascular negative cardio ROS Normal cardiovascular exam Rhythm:regular Rate:Normal     Neuro/Psych negative neurological ROS  negative psych ROS   GI/Hepatic negative GI ROS, Neg liver ROS,   Endo/Other  diabetes, Gestational  Renal/GU negative Renal ROS  negative genitourinary   Musculoskeletal   Abdominal   Peds  Hematology negative hematology ROS (+)   Anesthesia Other Findings   Reproductive/Obstetrics (+) Pregnancy                             Anesthesia Physical Anesthesia Plan  ASA: II  Anesthesia Plan: Epidural   Post-op Pain Management:    Induction:   PONV Risk Score and Plan:   Airway Management Planned:   Additional Equipment:   Intra-op Plan:   Post-operative Plan:   Informed Consent: I have reviewed the patients History and Physical, chart, labs and discussed the procedure including the risks, benefits and alternatives for the proposed anesthesia with the patient or authorized representative who has indicated his/her understanding and acceptance.       Plan Discussed with:   Anesthesia Plan Comments:         Anesthesia Quick Evaluation  

## 2017-05-23 NOTE — Brief Op Note (Signed)
05/23/2017 - 05/24/2017  8:05 PM  PATIENT:  [name] [name]  36 y.o. female  PRE-OPERATIVE DIAGNOSIS:   Fetal intolerance to labor, arrest of descent, previous cesarean section, class A1 GDM, term gestation  POST-OPERATIVE DIAGNOSIS: same   PROCEDURE:  Repeat cesarean section , kerr hysterotomy  SURGEON:  Surgeon(s) and Role:    * Maxie Better, MD - Primary  PHYSICIAN ASSISTANT:   ASSISTANTS: Arlan Organ, CNM   ANESTHESIA:   epidural Findings: live female ROP hyperextended, nl tubes and ovaries. Omental adhesions EBL:  Total I/O In: 2800 [I.V.:2800] Out: 1411 [Urine:250; Blood:1161]  BLOOD ADMINISTERED:none  DRAINS: none   LOCAL MEDICATIONS USED:  MARCAINE     SPECIMEN:  Source of Specimen:  placenta  DISPOSITION OF SPECIMEN:  N/A  COUNTS:  YES  TOURNIQUET:  * No tourniquets in log *  DICTATION: .Other Dictation: Dictation Number [id number]  PLAN OF CARE: Admit to inpatient   PATIENT DISPOSITION:  PACU - hemodynamically stable.   Delay start of Pharmacological VTE agent (>24hrs) due to surgical blood loss or risk of bleeding: no

## 2017-05-24 ENCOUNTER — Encounter (HOSPITAL_COMMUNITY): Payer: Self-pay | Admitting: Obstetrics and Gynecology

## 2017-05-24 ENCOUNTER — Encounter (HOSPITAL_COMMUNITY)
Admission: AD | Disposition: A | Discharge status: Home / Self Care | Payer: Self-pay | Source: Ambulatory Visit | Attending: Obstetrics and Gynecology

## 2017-05-24 LAB — CBC
HCT: 30.3 % — ABNORMAL LOW (ref 36.0–46.0)
Hemoglobin: 10 g/dL — ABNORMAL LOW (ref 12.0–15.0)
MCH: 27.5 pg (ref 26.0–34.0)
MCHC: 33 g/dL (ref 30.0–36.0)
MCV: 83.5 fL (ref 78.0–100.0)
PLATELETS: 185 10*3/uL (ref 150–400)
RBC: 3.63 MIL/uL — AB (ref 3.87–5.11)
RDW: 14.2 % (ref 11.5–15.5)
WBC: 25.8 10*3/uL — AB (ref 4.0–10.5)

## 2017-05-24 LAB — RPR: RPR Ser Ql: NONREACTIVE

## 2017-05-24 SURGERY — Surgical Case
Anesthesia: Regional | Site: Abdomen | Wound class: Clean Contaminated

## 2017-05-24 MED ORDER — INFLUENZA VAC SPLIT QUAD 0.5 ML IM SUSY
0.5000 mL | PREFILLED_SYRINGE | INTRAMUSCULAR | Status: AC
  Administered 2017-05-26: 0.5 mL via INTRAMUSCULAR

## 2017-05-24 SURGICAL SUPPLY — 43 items
BARRIER ADHS 3X4 INTERCEED (GAUZE/BANDAGES/DRESSINGS) ×2 IMPLANT
BENZOIN TINCTURE PRP APPL 2/3 (GAUZE/BANDAGES/DRESSINGS) ×2 IMPLANT
CHLORAPREP W/TINT 26ML (MISCELLANEOUS) ×2 IMPLANT
CLAMP CORD UMBIL (MISCELLANEOUS) IMPLANT
CLOTH BEACON ORANGE TIMEOUT ST (SAFETY) ×2 IMPLANT
CONTAINER PREFILL 10% NBF 15ML (MISCELLANEOUS) IMPLANT
DRAPE C SECTION CLR SCREEN (DRAPES) ×2 IMPLANT
DRSG OPSITE POSTOP 4X10 (GAUZE/BANDAGES/DRESSINGS) ×2 IMPLANT
ELECT REM PT RETURN 9FT ADLT (ELECTROSURGICAL) ×2
ELECTRODE REM PT RTRN 9FT ADLT (ELECTROSURGICAL) ×1 IMPLANT
EXTRACTOR VACUUM M CUP 4 TUBE (SUCTIONS) IMPLANT
GLOVE BIOGEL PI IND STRL 7.0 (GLOVE) ×2 IMPLANT
GLOVE BIOGEL PI INDICATOR 7.0 (GLOVE) ×2
GLOVE ECLIPSE 6.5 STRL STRAW (GLOVE) ×2 IMPLANT
GOWN STRL REUS W/TWL LRG LVL3 (GOWN DISPOSABLE) ×4 IMPLANT
KIT ABG SYR 3ML LUER SLIP (SYRINGE) IMPLANT
NEEDLE HYPO 22GX1.5 SAFETY (NEEDLE) ×2 IMPLANT
NEEDLE HYPO 25X5/8 SAFETYGLIDE (NEEDLE) IMPLANT
NS IRRIG 1000ML POUR BTL (IV SOLUTION) ×2 IMPLANT
PACK C SECTION WH (CUSTOM PROCEDURE TRAY) ×2 IMPLANT
PAD OB MATERNITY 4.3X12.25 (PERSONAL CARE ITEMS) ×2 IMPLANT
RTRCTR C-SECT PINK 25CM LRG (MISCELLANEOUS) IMPLANT
SPONGE LAP 18X18 X RAY DECT (DISPOSABLE) ×2 IMPLANT
STRIP CLOSURE SKIN 1/2X4 (GAUZE/BANDAGES/DRESSINGS) ×2 IMPLANT
SUT CHROMIC GUT AB #0 18 (SUTURE) IMPLANT
SUT MNCRL 0 VIOLET CTX 36 (SUTURE) ×3 IMPLANT
SUT MON AB 2-0 SH 27 (SUTURE)
SUT MON AB 2-0 SH27 (SUTURE) IMPLANT
SUT MON AB 3-0 SH 27 (SUTURE)
SUT MON AB 3-0 SH27 (SUTURE) IMPLANT
SUT MON AB 4-0 PS1 27 (SUTURE) IMPLANT
SUT MONOCRYL 0 CTX 36 (SUTURE) ×3
SUT PLAIN 2 0 (SUTURE)
SUT PLAIN 2 0 XLH (SUTURE) ×2 IMPLANT
SUT PLAIN ABS 2-0 CT1 27XMFL (SUTURE) IMPLANT
SUT VIC AB 0 CT1 36 (SUTURE) ×4 IMPLANT
SUT VIC AB 2-0 CT1 27 (SUTURE) ×1
SUT VIC AB 2-0 CT1 TAPERPNT 27 (SUTURE) ×1 IMPLANT
SUT VIC AB 4-0 KS 27 (SUTURE) ×2 IMPLANT
SUT VIC AB 4-0 PS2 27 (SUTURE) IMPLANT
SYR CONTROL 10ML LL (SYRINGE) ×2 IMPLANT
TOWEL OR 17X24 6PK STRL BLUE (TOWEL DISPOSABLE) ×2 IMPLANT
TRAY FOLEY BAG SILVER LF 14FR (SET/KITS/TRAYS/PACK) IMPLANT

## 2017-05-24 NOTE — Lactation Note (Signed)
This note was copied from a baby's chart. Lactation Consultation Note  Patient Name: [name] [name] ZOXWR'U Date: 05/24/2017 Reason for consult: Follow-up assessment  Infant was circumcised this morning & infant has fed once since. Mom is interested in having me to return to check latch. If infant isn't interested in feeding within the next couple of hours, I discussed option of hand-expressing and finger/spoon-feeding to entice him.   Mom has my # to call for assist w/next feeding.  Lurline Hare [facility] 05/24/2017, 4:18 PM

## 2017-05-24 NOTE — Progress Notes (Signed)
Pt CBG /dL at 1610 on admission to PACU.

## 2017-05-24 NOTE — Lactation Note (Signed)
This note was copied from a baby's chart. Lactation Consultation Note  Patient Name: [name] [name] UEAVW'U Date: 05/24/2017 Reason for consult: Follow-up assessment  Infant was too sleepy to latch. Mom was taught hand-expression & infant was given colostrum with a gloved finger. I recommended that Mom do this every couple of hours until infant awakens from his post-circ sleepiness.  Mom will check on flange size for her pump at home. She appears to need size 21 flanges.   Lurline Hare [facility] 05/24/2017, 5:20 PM

## 2017-05-24 NOTE — Progress Notes (Signed)
Patient ID: [name] [name], female   DOB: 09-May-1981, 36 y.o.   MRN: [id number] RN called with low BPs. Pulse, urine output, CBC stable, patient asymptomatic, no concerns.

## 2017-05-24 NOTE — Lactation Note (Signed)
This note was copied from a baby's chart. Lactation Consultation Note Mom has 36 yr old that she BF for 1 yr. Mom stated this baby is latching well. Mom has been BF in cradle position. Baby needed assistance to obtain deep latch. Mom had to feed the breast tissue into mouth. Mom has tubular breast, soft w/flat nipples at bottom end of breast. Very compressible for latching. Hand expressed easy flow of colostrum.  Mom is c/section, very sleepy. Discussed safety feeding, props and support.  Mom encouraged to feed baby 8-12 times/24 hours and with feeding cues. Newborn behavior, I&O, cluster feeding discussed. [facility] brochure given w/resources, support groups and LC services. Patient Name: [name] [name] ZOXWR'U Date: 05/24/2017 Reason for consult: Initial assessment   Maternal Data    Feeding Feeding Type: Breast Fed Length of feed: 30 min  LATCH Score Latch: Repeated attempts needed to sustain latch, nipple held in mouth throughout feeding, stimulation needed to elicit sucking reflex.  Audible Swallowing: A few with stimulation  Type of Nipple: Flat  Comfort (Breast/Nipple): Soft / non-tender  Hold (Positioning): Assistance needed to correctly position infant at breast and maintain latch.  LATCH Score: 6  Interventions Interventions: Breast feeding basics reviewed;Breast compression;Assisted with latch;Adjust position;Skin to skin;Support pillows;Breast massage;Position options;Hand express  Lactation Tools Discussed/Used     Consult Status Consult Status: Follow-up Date: 05/25/17 Follow-up type: In-patient    Twilla Khouri, Diamond Nickel 05/24/2017, 7:05 AM

## 2017-05-24 NOTE — Progress Notes (Signed)
Subjective: POD# 1 Information for the patient's newborn:  Mikell, Camp [[id number]]  female   circ planned Baby name: Denny Peon  Reports feeling well, but tired, baby in nursery so she can sleep. Feeding: breast Patient reports tolerating PO.  Breast symptoms: good latch Pain controlled with PO meds Denies HA/SOB/C/P/N/V/dizziness. Flatus none. She reports vaginal bleeding as normal, without clots. Has not ambulated yet, low BP and HR reported per RN, orthos wnl.  Objective:   VS:    Vitals:   05/23/17 2341 05/24/17 0041 05/24/17 0500 05/24/17 0900  BP: 114/60 (!) 112/58 99/60 (!) 87/44  Pulse: (!) 59 62  (!) 56  Resp: Temp: 98.2 F (36.8 C) 98.2 F (36.8 C)  98.1 F (36.7 C)  TempSrc: Oral Oral    SpO2:    99%  Weight:      Height:         Intake/Output Summary (Last 24 hours) at 05/24/17 1031 Last data filed at 05/24/17 0900  Gross per 24 hour  Intake             3800 ml  Output             3511 ml  Net              289 ml        Recent Labs  05/23/17 0955 05/24/17 0540  WBC 15.4* 25.8*  HGB 12.9 10.0*  HCT 38.1 30.3*  PLT 215 185     Blood type: --/--/B POS (09/29 1000)  Rubella: Immune (02/26 0000)     Physical Exam:  General: alert, cooperative and no distress CV: Regular rate and rhythm Resp: clear Abdomen: soft, nontender, hypoactive bowel sounds Incision: serous drainage present  > 50% dressing Uterine Fundus: firm, below umbilicus, nontender Lochia: minimal Ext: no edema, redness or tenderness in the calves or thighs      Assessment/Plan: 36 y.o.   POD# 1. [id number]                  Principal Problem:   Postpartum care following cesarean delivery 9/29 Active Problems:   Cesarean delivery delivered / Indication: NRFHT Leukocytosis, not febrile  - rpt cbc in am, monitor temps   Doing well, stable.     DC foley cath          Advance diet as tolerated Encourage rest when baby rests Breastfeeding support Encourage to  ambulate, warm fluids, shower Routine post-op care  Neta Mends, CNM, MSN 05/24/2017, 10:31 AM

## 2017-05-24 NOTE — Anesthesia Postprocedure Evaluation (Signed)
Anesthesia Post Note  Patient: [name] [name]  Procedure(s) Performed: Procedure(s) (LRB): CESAREAN SECTION (N/A)     Patient location during evaluation: Mother Baby Anesthesia Type: Epidural Level of consciousness: awake and alert and oriented Pain management: satisfactory to patient Vital Signs Assessment: post-procedure vital signs reviewed and stable Respiratory status: respiratory function stable Cardiovascular status: stable Postop Assessment: no headache, no backache, epidural receding, patient able to bend at knees, no signs of nausea or vomiting and adequate PO intake Anesthetic complications: no    Last Vitals:  Vitals:   05/24/17 0500 05/24/17 0900  BP: 99/60 (!) 87/44  Pulse:  (!) 56  Resp:  18  Temp:  36.7 C  SpO2:  99%    Last Pain:  Vitals:   05/24/17 0900  TempSrc:   PainSc: 0-No pain   Pain Goal: Patients Stated Pain Goal: 5 (05/23/17 2045)               Karleen Dolphin

## 2017-05-25 DIAGNOSIS — D62 Acute posthemorrhagic anemia: Secondary | ICD-10-CM

## 2017-05-25 LAB — CBC WITH DIFFERENTIAL/PLATELET
Basophils Absolute: 0 10*3/uL (ref 0.0–0.1)
Basophils Relative: 0 %
EOS ABS: 0.1 10*3/uL (ref 0.0–0.7)
Eosinophils Relative: 1 %
HEMATOCRIT: 28 % — AB (ref 36.0–46.0)
HEMOGLOBIN: 9.2 g/dL — AB (ref 12.0–15.0)
LYMPHS ABS: 3.8 10*3/uL (ref 0.7–4.0)
LYMPHS PCT: 22 %
MCH: 27.5 pg (ref 26.0–34.0)
MCHC: 32.9 g/dL (ref 30.0–36.0)
MCV: 83.8 fL (ref 78.0–100.0)
MONOS PCT: 6 %
Monocytes Absolute: 1 10*3/uL (ref 0.1–1.0)
NEUTROS ABS: 12 10*3/uL — AB (ref 1.7–7.7)
NEUTROS PCT: 71 %
Platelets: 200 10*3/uL (ref 150–400)
RBC: 3.34 MIL/uL — AB (ref 3.87–5.11)
RDW: 14.3 % (ref 11.5–15.5)
WBC: 16.9 10*3/uL — AB (ref 4.0–10.5)

## 2017-05-25 LAB — BIRTH TISSUE RECOVERY COLLECTION (PLACENTA DONATION)

## 2017-05-25 LAB — GLUCOSE, CAPILLARY: Glucose-Capillary: 117 mg/dL — ABNORMAL HIGH (ref 65–99)

## 2017-05-25 MED ORDER — FERROUS SULFATE 325 (65 FE) MG PO TABS
325.0000 mg | ORAL_TABLET | Freq: Two times a day (BID) | ORAL | Status: DC
  Administered 2017-05-25 – 2017-05-26 (×3): 325 mg via ORAL
  Filled 2017-05-25 (×3): qty 1

## 2017-05-25 MED ORDER — MAGNESIUM OXIDE 400 (241.3 MG) MG PO TABS
400.0000 mg | ORAL_TABLET | Freq: Every day | ORAL | Status: DC
  Administered 2017-05-25 – 2017-05-26 (×2): 400 mg via ORAL
  Filled 2017-05-25 (×3): qty 1

## 2017-05-25 NOTE — Lactation Note (Addendum)
This note was copied from a baby's chart. Lactation Consultation Note  Patient Name: [name] [name] ZOXWR'U Date: 05/25/2017   Mom is experiencing sore nipples/painful latch. The infant's latch became much better once his mandible was lowered & a more asymmetric latch technique was used (specifics of an asymmetric latch shown via [employer]). Dad was shown how to lower mandible to widen gape.  Mom's nipple shape was not distorted when infant released his latch. Infant had many frequent swallows audible to naked ear during feeding. Mom's breasts are filling.  Comfort Gels provided by RN; instructions for use reviewed. RN had provided a nipple shield, but per Mom, infant did not like it & I did not find it necessary.   Mom has my # to call if needed for further assist this evening.  Lurline Hare [facility] 05/25/2017, 7:09 PM

## 2017-05-25 NOTE — Op Note (Signed)
[name], [name]                 ACCOUNT NO.:  [id number]  MEDICAL RECORD NO.:  [id number]  LOCATION:                                 FACILITY:  PHYSICIAN:  Maxie Better, M.D.DATE OF BIRTH:  1980-10-12  DATE OF PROCEDURE:  05/23/2017 DATE OF DISCHARGE:                              OPERATIVE REPORT   PREOPERATIVE DIAGNOSES: 1. Fetal intolerance to labor. 2. Arrest of descent. 3. Failed vaginal birth after cesarean. 4. Class A1 gestational diabetes.  PROCEDURES: 1. Repeat cesarean section, Kerr hysterotomy.  POSTOPERATIVE DIAGNOSES: 1. Fetal intolerance to labor. 2. Arrest of descent. 3. Failed vaginal birth after cesarean. 4. Class A1 gestational diabetes.  ANESTHESIA:  Epidural.  SURGEON:  Maxie Better, M.D.  ASSISTANT:  Arlan Organ, CNM.  DESCRIPTION OF PROCEDURE:  Under adequate epidural anesthesia, the patient was placed in the supine position with a left lateral tilt.  She was sterilely prepped and draped in the usual fashion and indwelling Foley catheter was already in place draining blood-tinged urine.  The abdomen was sterilely prepped and draped in the usual fashion.  0.25% Marcaine was injected along the previous Pfannenstiel skin incision site.  Pfannenstiel skin incision was then made, carried down to the rectus fascia.  Rectus fascia was opened transversely.  Rectus fascia was then bluntly and sharply dissected off the rectus muscle in the midline.  The parietal peritoneum was opened bluntly and extended.  The vesicouterine peritoneum was subsequently opened and extended superiorly, inferiorly.  The bladder was noted to be high on the field. Nonetheless, the parietal peritoneum having been opened was extended. Attempt at [facility] retractor with placement was eventually done; however, due to the high position of the bladder, it did not stay in, therefore, a bladder retractor was used.  A Sims retractor was used as well.  The bladder peritoneum  was opened transversely.  Bladder was gently dissected off the lower uterine segment, displaced inferiorly.  A curvilinear low transverse uterine incision was then made and extended with bandage scissors.  Subsequent delivery of hyperextended right occiput posterior live female was accomplished.  The baby was bulb suctioned at abdomen.  Cord was clamped, cut.  The baby was transferred to the awaiting pediatricians who assigned Apgars 8 and 9 at 1 and 5 minutes.  The placenta was manually removed.  Uterine cavity was cleaned of debris and trailing membranes were also removed.  The uterine incision had an extension in the midline in the lower portion. Separately, extension was then closed with 0 Vicryl running lock stitch. The remaining incision was then closed in 2 layers using 0 Monocryl running lock stitch imbricating with 0 Monocryl suture.  Good hemostasis was noted at that point.  Normal tubes and ovaries were noted bilaterally.  The abdomen was irrigated and suctioned of debris. Interceed was placed on the lower uterine segment in an inverted T- fashion.  The parietal peritoneum was then closed with 2-0 Vicryl.  The rectus fascia was closed with 0 Vicryl x 2.  The subcutaneous area was irrigated, small bleeders cauterized, interrupted 2-0 plain sutures placed, and the skin approximated using subcuticular 4-0 Vicryl closure, Steri-Strips, and benzoin.  SPECIMEN:  Placenta not  sent to Pathology.  ESTIMATED BLOOD LOSS:  1161.mml( weighed)  URINE OUTPUT:  250 mL.  INTRAOPERATIVE FLUIDS:  2800 mL.  COUNTS:  Sponge and instrument counts x2 were correct.  COMPLICATIONS:  None.  DISPOSITION:  The patient tolerated the procedure well, was transferred to the recovery room in stable condition.     Maxie Better, M.D.   ______________________________ Maxie Better, M.D.    [place]/MEDQ  D:  05/23/2017  T:  05/24/2017  Job:  [id number]

## 2017-05-25 NOTE — Progress Notes (Addendum)
POSTOPERATIVE DAY # 2 S/P Repeat LTCS for NRHFR, Arrest of Descent, Prior LTCS, Baby Boy "Denny Peon"   S:         Reports feeling good, desires to stay until tomorrow  Reports some weakness in left leg/hip - discussed with anesthesia yesterday, states she is walking more and gaining strenght             Tolerating po intake / no nausea / no vomiting / + flatus / no BM  Denies dizziness, SOB, or CP             Bleeding is light             Pain controlled with Motrin             Up ad lib / ambulatory/ voiding QS  Newborn breast feeding - reports some nipple soreness today, and would like to see lactation / Circumcision - completed yesterday    O:  VS: BP 121/60   Pulse (!) 56   Temp 97.9 F (36.6 C) Comment: mom ate ice  Resp 18   Ht  (1.651 m)   Wt 88.9 kg (196 lb)   LMP 08/25/2016   SpO2 98%   Breastfeeding? Unknown   BMI 32.62 kg/m    LABS:               Recent Labs  05/24/17 0540 05/25/17 0548  WBC 25.8* 16.9*  HGB 10.0* 9.2*  PLT 185 200               Bloodtype: --/--/B POS (09/29 1000)  Rubella: Immune (02/26 0000)                                             I&O: Intake/Output      09/30 0701 - 10/01 0700 10/01 0701 - 10/02 0700   Urine (mL/kg/hr) 1850 (0.9)    Total Output 1850     Net -1850                       Physical Exam:             Alert and Oriented X3  Lungs: Clear and unlabored  Heart: regular rate and rhythm / +murmurs  Abdomen: soft, non-tender, non-distended, active bowel sounds in all quadrants             Fundus: firm, non-tender, U-1             Dressing: honeycomb dsg with steri-strips c/d/i              Incision:  approximated with sutures / no erythema / no ecchymosis / + drainage  Perineum: intact, minimal edema   Lochia: appropriate, no clots   Extremities: trace, non-pitting BLE edema, no calf pain or tenderness  A:        POD # 2 S/P Repeat LTCS for NRHFR, Arrest of Descent, Prior LTCS            A1GDM - CBGs stable,  delivered   ABL Anemia - stable asymptomatic   Leukocytosis - WBC trending down, no s/s of infection   P:        Routine postoperative care              Encouraged ambulation in halls today   See lactation today  Anticipate discharge home tomorrow   Carlean Jews, MSN, CNM [facility]

## 2017-05-26 MED ORDER — FERROUS SULFATE 325 (65 FE) MG PO TABS: 325.0000 mg | ORAL_TABLET | Freq: Two times a day (BID) | ORAL | 3 refills | Status: DC

## 2017-05-26 MED ORDER — IBUPROFEN 600 MG PO TABS: 600.0000 mg | ORAL_TABLET | Freq: Four times a day (QID) | ORAL | 0 refills | Status: DC

## 2017-05-26 NOTE — Progress Notes (Signed)
POSTOPERATIVE DAY # 3 S/P Repeat LTCS for NRHFR, Arrest of Descent, Prior LTCS, Baby Boy "Denny Peon"   S:         Reports feeling better today, states the weakness in her left leg/hip has improved             Tolerating po intake / no nausea / no vomiting / + flatus / + BM x3  Denies dizziness, SOB, or CP             Bleeding is moderate             Pain controlled with Motrin             Up ad lib / ambulatory/ voiding QS  Newborn breast feeding - going well, reports having a f/u with outpatient lactation on Friday/ Circumcision - completed yesterday    O:  VS: BP 111/70   Pulse 61   Temp 97.7 F (36.5 C) (Oral)   Resp 18   Ht  (1.651 m)   Wt 88.9 kg (196 lb)   LMP 08/25/2016   SpO2 98%   Breastfeeding? Unknown   BMI 32.62 kg/m    LABS:               Recent Labs  05/24/17 0540 05/25/17 0548  WBC 25.8* 16.9*  HGB 10.0* 9.2*  PLT 185 200               Bloodtype: --/--/B POS (09/29 1000)  Rubella: Immune                                         Physical Exam:             Alert and Oriented X3  Lungs: Clear and unlabored  Heart: regular rate and rhythm / no murmurs  Abdomen: soft, non-tender, non-distended , active bowel sounds in all quadrants             Fundus: firm, non-tender, U-1             Dressing : honeycomb dsg with steri-strips c/d/i              Incision:  approximated with sutures / no erythema / no ecchymosis / no drainage  Perineum: intact, trace edema  Lochia: appropriate, no clots  Extremities: no pedal edema, no calf pain or tenderness  A:        POD # 3 S/P Repeat LTCS for NRHFR, Arrest of Descent, Prior LTCS            A1GDM - CBGs stable, delivered              ABL Anemia - stable asymptomatic              Leukocytosis - WBC trending down, no s/s of infection   P:        Routine postoperative care              Discharge home today  [facility] discharge book and instructions reviewed  May take OTC Tylenol PRN for added pain control  Declined  narcotic pain meds   F/u in 6 weeks with Dr. Cherly Hensen - will need 2 hr GTT   Carlean Jews, MSN, CNM [facility]

## 2017-05-26 NOTE — Lactation Note (Signed)
This note was copied from a baby's chart. Lactation Consultation Note  Patient Name: [name] [name] [id number] Date: 05/26/2017 Reason for consult: Follow-up assessment;Infant weight loss (10 % WEIGHT LOSS, ELEVATED bILIRUBIN 15.6 )  Baby is 58 hours old  Mom is an experienced breast feeding mother , per mom milk feels like it is coming in, nipples sore ,  LC assessed and noted the nipples to be pinky red, no breakdown, using comfort gels, and LC instructed  Mom on the use shells between feedings except for sleep.  LC reviewed sore nipple and engorgement prevention and tx.  Per mom will have a DEBP at home. LC recommended for mom post pump after 4 feedings a day  To increase volume, spoon feed back to baby.  Will be going to [facility]. [place] .  Mother informed of post-discharge support and given phone number to the lactation department, including services for phone call assistance; out-patient appointments; and breastfeeding support group. List of other breastfeeding resources in the community given in the handout. Encouraged mother to call for problems or concerns related to breastfeeding.   Maternal Data Has patient been taught Hand Expression?: Yes (REVIEWED )  Feeding Feeding Type: Breast Fed Length of feed: 17 min (SWALLOWS NOTED./ RIGHT BREAST / CROSS CRADLE )  LATCH Score Latch: Grasps breast easily, tongue down, lips flanged, rhythmical sucking.  Audible Swallowing: Spontaneous and intermittent  Type of Nipple: Everted at rest and after stimulation  Comfort (Breast/Nipple): Filling, red/small blisters or bruises, mild/mod discomfort  Hold (Positioning): Assistance needed to correctly position infant at breast and maintain latch.  LATCH Score: 8  Interventions Interventions: Breast feeding basics reviewed;Assisted with latch;Breast massage;Breast compression;Adjust position;Support pillows;Shells;Comfort gels;DEBP  Lactation Tools Discussed/Used Tools:  Shells;Pump;Comfort gels Nipple shield size:  (PER MOM NOT USING THE NS ) Shell Type: Inverted Breast pump type: Manual WIC Program: No   Consult Status Consult Status: Follow-up Date: 05/29/17 Follow-up type: Out-patient [facility] )    Matilde Sprang Katyra Tomassetti 05/26/2017, 12:00 PM

## 2017-05-26 NOTE — Discharge Summary (Signed)
Obstetric Discharge Summary   Patient Name: [name] [name] DOB: Jul 25, 1981 MRN: [id number]  Date of Admission: 05/23/2017 Date of Discharge: 05/26/2017 Date of Delivery: 05/24/2017 Gestational Age at Delivery: [redacted]w[redacted]d  Primary OB: [facility] - Dr. Cherly Hensen   Antepartum complications:  - Hx. Of LTCS - A1GDM - Hx. Of LEEP - Unexplained Elevated AFP, Fetal Echo WNL - Polyhydramnios   Prenatal Labs:  ABO, Rh:  B positive Antibody:  neg Rubella:  Immune RPR:   NR HBsAg:  neg  HIV:   neg GBS:   neg  Admitting Diagnosis: Previous LTCS in active labor at Term   Secondary Diagnoses: Patient Active Problem List   Diagnosis Date Noted  . Postoperative anemia due to acute blood loss 05/25/2017  . Cesarean delivery delivered / Indication: NRFHT 05/24/2017  . Postpartum care following cesarean delivery 9/29 05/23/2017  . [redacted] weeks gestation of pregnancy   . Abnormal findings on antenatal screening   . Advanced maternal age in multigravida, second trimester   . Eczema of hand 08/31/2013  . Routine general medical examination at a health care facility 08/31/2013    Augmentation: AROM, Pitocin   Date of Delivery: 05/24/2017 Delivered By: Dr. Cherly Hensen D. Renae Fickle CNM Delivery Type: repeat cesarean section, low transverse incision  Newborn Data: Live born female  Birth Weight: 8 lb 4.5 oz (3756 g) APGAR: 9, 9   Postpartum Course  (Cesarean Section):  Pt. Had a failed TOLAC for fetal intolerance to labor and arrest of descent resulting in a repeat LTCS. Patient had an uncomplicated postpartum course.  By time of discharge on POD#3, her pain was controlled on oral pain medications; she had appropriate lochia and was ambulating, voiding without difficulty, tolerating regular diet and passing flatus.   She was deemed stable for discharge to home.     Labs: CBC Latest Ref Rng & Units 05/25/2017 05/24/2017 05/23/2017  WBC 4.0 - 10.5 K/uL 16.9(H) 25.8(H) 15.4(H)  Hemoglobin 12.0 - 15.0 g/dL  7.8(G) 10.0(L) 12.9  Hematocrit 36.0 - 46.0 % 28.0(L) 30.3(L) 38.1  Platelets 150 - 400 K/uL 200 185 215   B POS  Physical exam:  BP 111/70   Pulse 61   Temp 97.7 F (36.5 C) (Oral)   Resp 18   Ht  (1.651 m)   Wt 88.9 kg (196 lb)   LMP 08/25/2016   SpO2 98%   Breastfeeding? Unknown   BMI 32.62 kg/m  General: alert and no distress Pulm: normal respiratory effort Lochia: appropriate Abdomen: soft, NT Uterine Fundus: firm, below umbilicus Perineum: healing well, no significant erythema, no significant edema Incision: c/d/i, healing well, no significant drainage, no dehiscence, no significant erythema Extremities: No evidence of DVT seen on physical exam. No lower extremity edema.  Disposition: stable, discharge to home Baby Feeding: breast milk Baby Disposition: home with mom  Contraception: unsure, possible vasectomy   Rh Immune globulin given: N/A Rubella vaccine given: N/A Tdap vaccine given in AP or PP setting: 03/31/2017 Flu vaccine given in AP or PP setting: given 05/26/17   Plan:  [name] [name] was discharged to home in good condition. Follow-up appointment at [facility] in 6 weeks.  Discharge Instructions: Per After Visit Summary. Activity: Advance as tolerated. Pelvic rest for 6 weeks.  Refer to After Visit Summary Diet: Regular, Heart Healthy Discharge Medications: Allergies as of 05/26/2017      Reactions   Ceclor [cefaclor] Hives   Latex Hives   Sulfa Antibiotics Swelling   Childhood reaction  Medication List    STOP taking these medications   calcium carbonate 500 MG chewable tablet Commonly known as:  TUMS - dosed in mg elemental calcium   EVENING PRIMROSE OIL PO     TAKE these medications   BIOTIN PO Take 1 tablet by mouth daily.   famotidine 20 MG tablet Commonly known as:  PEPCID Take 20 mg by mouth daily.   ferrous sulfate 325 (65 FE) MG tablet Take 1 tablet (325 mg total) by mouth 2 (two) times daily with a meal.    ibuprofen 600 MG tablet Commonly known as:  ADVIL,MOTRIN Take 1 tablet (600 mg total) by mouth every 6 (six) hours.   lactobacillus acidophilus Tabs tablet Take 1 tablet by mouth daily.   PRENATAL VITAMIN PO Take 1 tablet by mouth daily.      Outpatient follow up:  Follow-up Information    Maxie Better, MD. Schedule an appointment as soon as possible for a visit in 6 week(s).   Specialty:  Obstetrics and Gynecology Why:  Postpartum visit, needs 2 hour GTT Contact information: [address] [place] [address] [phone]           Signed:  Carlean Jews, MSN, CNM [facility]

## 2017-05-29 NOTE — Addendum Note (Signed)
Addendum  created 05/29/17 1119 by Donney Dice D, CRNA   Anesthesia Event edited

## 2017-06-03 ENCOUNTER — Encounter (HOSPITAL_COMMUNITY): Payer: Self-pay | Admitting: Obstetrics and Gynecology

## 2017-06-03 NOTE — Addendum Note (Signed)
Addendum  created 06/03/17 1604 by Phillips Grout, MD   Anesthesia Event edited

## 2017-06-08 ENCOUNTER — Encounter (HOSPITAL_COMMUNITY): Payer: Self-pay | Admitting: Obstetrics and Gynecology

## 2017-06-08 NOTE — Addendum Note (Signed)
Addendum  created 06/08/17 0924 by Phillips Grout, MD   Anesthesia Event deleted, Anesthesia Event edited

## 2017-07-13 DIAGNOSIS — K080 Exfoliation of teeth due to systemic causes: Secondary | ICD-10-CM | POA: Diagnosis not present

## 2017-07-15 DIAGNOSIS — Z130 Encounter for screening for diseases of the blood and blood-forming organs and certain disorders involving the immune mechanism: Secondary | ICD-10-CM | POA: Diagnosis not present

## 2017-08-05 DIAGNOSIS — K080 Exfoliation of teeth due to systemic causes: Secondary | ICD-10-CM | POA: Diagnosis not present

## 2017-08-12 DIAGNOSIS — M25552 Pain in left hip: Secondary | ICD-10-CM | POA: Diagnosis not present

## 2018-03-24 DIAGNOSIS — Z3202 Encounter for pregnancy test, result negative: Secondary | ICD-10-CM | POA: Diagnosis not present

## 2018-04-05 ENCOUNTER — Ambulatory Visit: Payer: Federal, State, Local not specified - PPO | Admitting: Physician Assistant

## 2018-04-14 ENCOUNTER — Ambulatory Visit: Payer: Federal, State, Local not specified - PPO | Admitting: Physician Assistant

## 2018-04-22 ENCOUNTER — Encounter: Payer: Self-pay | Admitting: Physician Assistant

## 2018-04-22 ENCOUNTER — Ambulatory Visit (INDEPENDENT_AMBULATORY_CARE_PROVIDER_SITE_OTHER): Payer: Federal, State, Local not specified - PPO | Admitting: Physician Assistant

## 2018-04-22 VITALS — BP 109/73 | HR 56 | Ht 65.0 in | Wt 175.0 lb

## 2018-04-22 DIAGNOSIS — Z8632 Personal history of gestational diabetes: Secondary | ICD-10-CM | POA: Diagnosis not present

## 2018-04-22 DIAGNOSIS — Z862 Personal history of diseases of the blood and blood-forming organs and certain disorders involving the immune mechanism: Secondary | ICD-10-CM

## 2018-04-22 DIAGNOSIS — Z130 Encounter for screening for diseases of the blood and blood-forming organs and certain disorders involving the immune mechanism: Secondary | ICD-10-CM | POA: Diagnosis not present

## 2018-04-22 DIAGNOSIS — L658 Other specified nonscarring hair loss: Secondary | ICD-10-CM

## 2018-04-22 DIAGNOSIS — Z139 Encounter for screening, unspecified: Secondary | ICD-10-CM

## 2018-04-22 DIAGNOSIS — Z7689 Persons encountering health services in other specified circumstances: Secondary | ICD-10-CM

## 2018-04-22 DIAGNOSIS — R001 Bradycardia, unspecified: Secondary | ICD-10-CM

## 2018-04-22 DIAGNOSIS — Z1329 Encounter for screening for other suspected endocrine disorder: Secondary | ICD-10-CM

## 2018-04-22 DIAGNOSIS — Z23 Encounter for immunization: Secondary | ICD-10-CM

## 2018-04-22 DIAGNOSIS — Z13220 Encounter for screening for lipoid disorders: Secondary | ICD-10-CM

## 2018-04-22 NOTE — Patient Instructions (Signed)
Prediabetes Eating Plan Prediabetes-also called impaired glucose tolerance or impaired fasting glucose-is a condition that causes blood sugar (blood glucose) levels to be higher than normal. Following a healthy diet can help to keep prediabetes under control. It can also help to lower the risk of type 2 diabetes and heart disease, which are increased in people who have prediabetes. Along with regular exercise, a healthy diet:  Promotes weight loss.  Helps to control blood sugar levels.  Helps to improve the way that the body uses insulin.  What do I need to know about this eating plan?  Use the glycemic index (GI) to plan your meals. The index tells you how quickly a food will raise your blood sugar. Choose low-GI foods. These foods take a longer time to raise blood sugar.  Pay close attention to the amount of carbohydrates in the food that you eat. Carbohydrates increase blood sugar levels.  Keep track of how many calories you take in. Eating the right amount of calories will help you to achieve a healthy weight. Losing about 7 percent of your starting weight can help to prevent type 2 diabetes.  You may want to follow a Mediterranean diet. This diet includes a lot of vegetables, lean meats or fish, whole grains, fruits, and healthy oils and fats. What foods can I eat? Grains Whole grains, such as whole-wheat or whole-grain breads, crackers, cereals, and pasta. Unsweetened oatmeal. Bulgur. Barley. Quinoa. Brown rice. Corn or whole-wheat flour tortillas or taco shells. Vegetables Lettuce. Spinach. Peas. Beets. Cauliflower. Cabbage. Broccoli. Carrots. Tomatoes. Squash. Eggplant. Herbs. Peppers. Onions. Cucumbers. Brussels sprouts. Fruits Berries. Bananas. Apples. Oranges. Grapes. Papaya. Mango. Pomegranate. Kiwi. Grapefruit. Cherries. Meats and Other Protein Sources Seafood. Lean meats, such as chicken and turkey or lean cuts of pork and beef. Tofu. Eggs. Nuts. Beans. Dairy Low-fat or  fat-free dairy products, such as yogurt, cottage cheese, and cheese. Beverages Water. Tea. Coffee. Sugar-free or diet soda. Seltzer water. Milk. Milk alternatives, such as soy or almond milk. Condiments Mustard. Relish. Low-fat, low-sugar ketchup. Low-fat, low-sugar barbecue sauce. Low-fat or fat-free mayonnaise. Sweets and Desserts Sugar-free or low-fat pudding. Sugar-free or low-fat ice cream and other frozen treats. Fats and Oils Avocado. Walnuts. Olive oil. The items listed above may not be a complete list of recommended foods or beverages. Contact your dietitian for more options. What foods are not recommended? Grains Refined white flour and flour products, such as bread, pasta, snack foods, and cereals. Beverages Sweetened drinks, such as sweet iced tea and soda. Sweets and Desserts Baked goods, such as cake, cupcakes, pastries, cookies, and cheesecake. The items listed above may not be a complete list of foods and beverages to avoid. Contact your dietitian for more information. This information is not intended to replace advice given to you by your health care provider. Make sure you discuss any questions you have with your health care provider. Document Released: 12/26/2014 Document Revised: 01/17/2016 Document Reviewed: 09/06/2014 Elsevier Interactive Patient Education  2017 Elsevier Inc.  

## 2018-04-22 NOTE — Progress Notes (Signed)
HPI:                                                                [name] [name] is a 37 y.o. female who presents to [facility]: Primary Care Sports Medicine today to establish care  Current concerns:   History of gestational diabetes with both pregnancies. Son is now 65 months old. She follows a low-carb, low-sodium diet. Requesting A1c check  Depression screen [facility] 2/9 04/22/2018 02/04/2017  Decreased Interest 0 0  Down, Depressed, Hopeless 0 0  PHQ - 2 Score 0 0  Altered sleeping 0 -  Tired, decreased energy 0 -  Change in appetite 0 -  Feeling bad or failure about yourself  0 -  Trouble concentrating 0 -  Moving slowly or fidgety/restless 0 -  Suicidal thoughts 0 -  PHQ-9 Score 0 -  Difficult doing work/chores Not difficult at all -    GAD 7 : Generalized Anxiety Score 04/22/2018  Nervous, Anxious, on Edge 1  Control/stop worrying 1  Worry too much - different things 0  Trouble relaxing 0  Restless 0  Easily annoyed or irritable 0  Afraid - awful might happen 0  Total GAD 7 Score 2  Anxiety Difficulty Not difficult at all      Past Medical History:  Diagnosis Date  . Gestational diabetes    Past Surgical History:  Procedure Laterality Date  . CERVICAL BIOPSY  W/ LOOP ELECTRODE EXCISION     2001  . CESAREAN SECTION  07/26/2012   Procedure: CESAREAN SECTION;  Surgeon: Serita Kyle, MD;  Location: [facility];  Service: Obstetrics;  Laterality: N/A;  Primary Cesarean Section Delivery Baby Girl @  [phone], Apgars 8/9   . CESAREAN SECTION N/A 05/24/2017   Procedure: CESAREAN SECTION;  Surgeon: Maxie Better, MD;  Location: [facility] BIRTHING SUIT[facility];  Service: Obstetrics;  Laterality: N/A;  . WISDOM TOOTH EXTRACTION     Social History   Tobacco Use  . Smoking status: Never Smoker  . Smokeless tobacco: Never Used  Substance Use Topics  . Alcohol use: Not Currently   family history includes Arthritis in her mother; COPD in her father and  mother; Colon cancer in her maternal grandmother; Deep vein thrombosis in her father; Diabetes in her father; Hepatitis C in her father; Hypertension in her father; Lung cancer in her father; Peripheral vascular disease in her father; Skin cancer in her mother.    ROS: negative except as noted in the HPI  Medications: Current Outpatient Medications  Medication Sig Dispense Refill  . BIOTIN PO Take 1 tablet by mouth daily.     Marland Kitchen CAMILA 0.35 MG tablet Take 1 tablet by mouth daily.  3  . ferrous sulfate 325 (65 FE) MG tablet Take 1 tablet (325 mg total) by mouth 2 (two) times daily with a meal. 60 tablet 3  . lactobacillus acidophilus (BACID) TABS tablet Take 1 tablet by mouth daily.     . Multiple Vitamins-Minerals (MULTIVITAMIN ADULT PO) Take by mouth.    . Prenatal Vit-Fe Fumarate-FA (PRENATAL VITAMIN PO) Take 1 tablet by mouth daily.      No current facility-administered medications for this visit.    Allergies  Allergen Reactions  . Ceclor [Cefaclor] Rash  .  Latex Rash  . Sulfa Antibiotics Rash    Childhood reaction       Objective:  BP 109/73   Pulse (!) 56   Ht 5\' 5"  (1.651 m)   Wt 175 lb (79.4 kg)   LMP 03/22/2018   Breastfeeding? Yes   BMI 29.12 kg/m  Gen:  alert, not ill-appearing, no distress, appropriate for age HEENT: head normocephalic without obvious abnormality, conjunctiva and cornea clear, trachea midline Pulm: Normal work of breathing, normal phonation, clear to auscultation bilaterally, no wheezes, rales or rhonchi CV: bradycardic rate, regular rhythm, s1 and s2 distinct, no murmurs, clicks or rubs  Neuro: alert and oriented x 3, no tremor MSK: extremities atraumatic, normal gait and station Skin: intact, no rashes on exposed skin, no jaundice, no cyanosis Psych: well-groomed, cooperative, good eye contact, euthymic mood, affect mood-congruent, speech is articulate, and thought processes clear and goal-directed    No results found for this or any  previous visit (from the past 72 hour(s)). No results found.    Assessment and Plan: 37 y.o. female with   .Diagnoses and all orders for this visit:  Encounter to establish care  History of gestational diabetes -     Hemoglobin A1c  Screening for lipid disorders -     Lipid Panel w/reflex Direct LDL  Screening for blood disease -     CBC -     Comprehensive metabolic panel  Lactating mother  Bradycardia -     TSH + free T4  Need for immunization against influenza -     Flu Vaccine QUAD 36+ mos IM  History of anemia -     CBC  Screening for thyroid disorder -     TSH + free T4  Female pattern hair loss -     TSH + free T4     - Personally reviewed PMH, PSH, PFH, medications, allergies, HM - Age-appropriate cancer screening: Pap UTD per patient, requesting records from Dr. Cherly Hensenousins - Influenza given today - Tdap UTD - PHQ2 negative - Fasting labs pending   Patient education and anticipatory guidance given Patient agrees with treatment plan Follow-up as needed if symptoms worsen or fail to improve  Levonne Hubertharley E. Cummings PA-C

## 2018-04-23 LAB — COMPREHENSIVE METABOLIC PANEL
AG Ratio: 1.3 (calc) (ref 1.0–2.5)
ALKALINE PHOSPHATASE (APISO): 64 U/L (ref 33–115)
ALT: 15 U/L (ref 6–29)
AST: 20 U/L (ref 10–30)
Albumin: 4.3 g/dL (ref 3.6–5.1)
BUN: 13 mg/dL (ref 7–25)
CHLORIDE: 105 mmol/L (ref 98–110)
CO2: 27 mmol/L (ref 20–32)
CREATININE: 0.72 mg/dL (ref 0.50–1.10)
Calcium: 9.1 mg/dL (ref 8.6–10.2)
GLUCOSE: 89 mg/dL (ref 65–139)
Globulin: 3.2 g/dL (calc) (ref 1.9–3.7)
Potassium: 4.1 mmol/L (ref 3.5–5.3)
Sodium: 140 mmol/L (ref 135–146)
Total Bilirubin: 0.3 mg/dL (ref 0.2–1.2)
Total Protein: 7.5 g/dL (ref 6.1–8.1)

## 2018-04-23 LAB — LIPID PANEL W/REFLEX DIRECT LDL
CHOLESTEROL: 159 mg/dL (ref <200)
HDL: 50 mg/dL — AB (ref >50)
LDL Cholesterol (Calc): 93 mg/dL (calc)
Non-HDL Cholesterol (Calc): 109 mg/dL (calc) (ref <130)
TRIGLYCERIDES: 74 mg/dL (ref <150)
Total CHOL/HDL Ratio: 3.2 (calc) (ref <5.0)

## 2018-04-23 LAB — CBC
HCT: 42.1 % (ref 35.0–45.0)
Hemoglobin: 14 g/dL (ref 11.7–15.5)
MCH: 28 pg (ref 27.0–33.0)
MCHC: 33.3 g/dL (ref 32.0–36.0)
MCV: 84.2 fL (ref 80.0–100.0)
MPV: 11.4 fL (ref 7.5–12.5)
Platelets: 174 Thousand/uL (ref 140–400)
RBC: 5 Million/uL (ref 3.80–5.10)
RDW: 13.1 % (ref 11.0–15.0)
WBC: 7.1 Thousand/uL (ref 3.8–10.8)

## 2018-04-23 LAB — HEMOGLOBIN A1C
Hgb A1c MFr Bld: 5.5 %{Hb}
Mean Plasma Glucose: 111 (calc)
eAG (mmol/L): 6.2 (calc)

## 2018-04-23 LAB — TSH+FREE T4: TSH W/REFLEX TO FT4: 2.14 m[IU]/L

## 2018-04-30 ENCOUNTER — Encounter: Payer: Self-pay | Admitting: Physician Assistant

## 2018-05-20 IMAGING — US US MFM OB DETAIL+14 WK
1 series · 13 of 28 positions shown · non-contrast
Comparison: none

[Series 1: us mfm ob detail+14 wk · 13 of 114 slices shown]
[im 5/114]
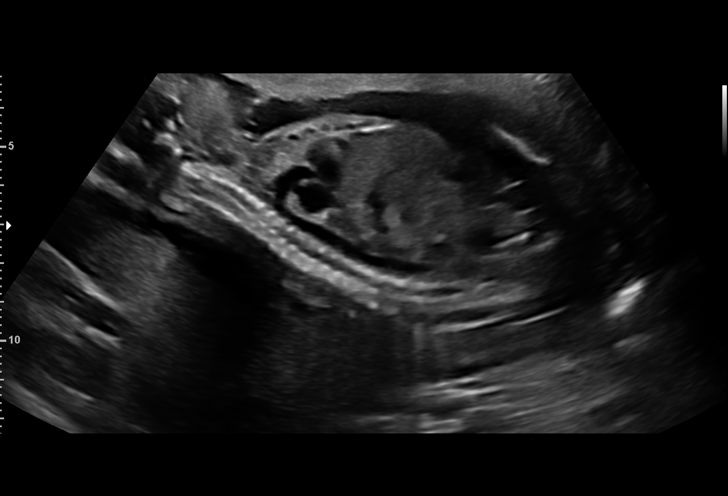
[im 13/114]
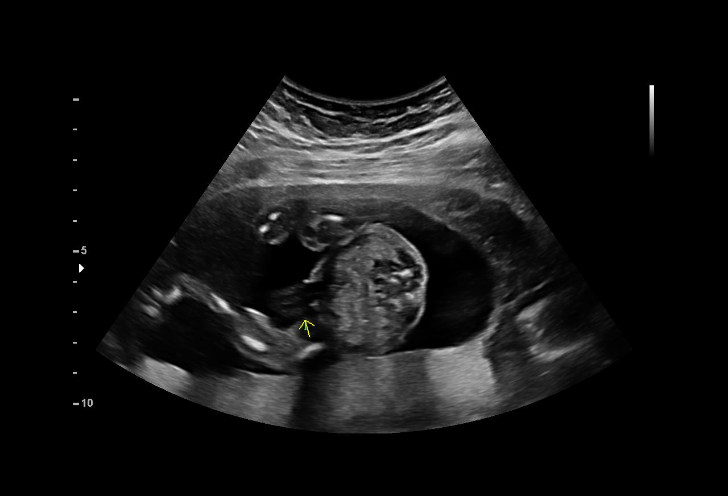
[im 21/114]
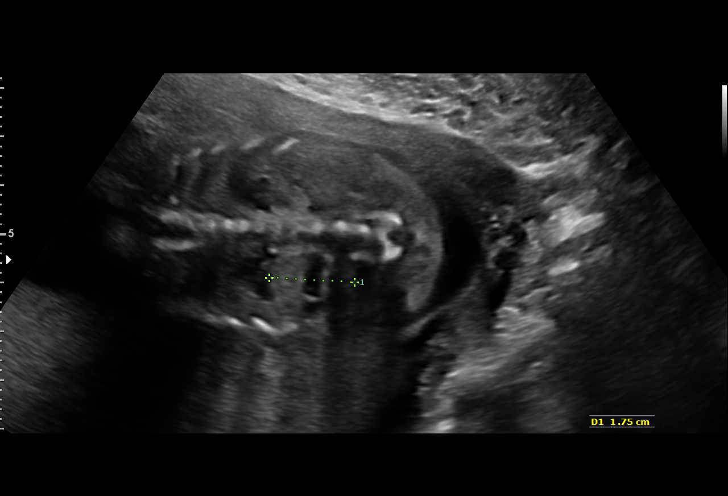
[im 30/114]
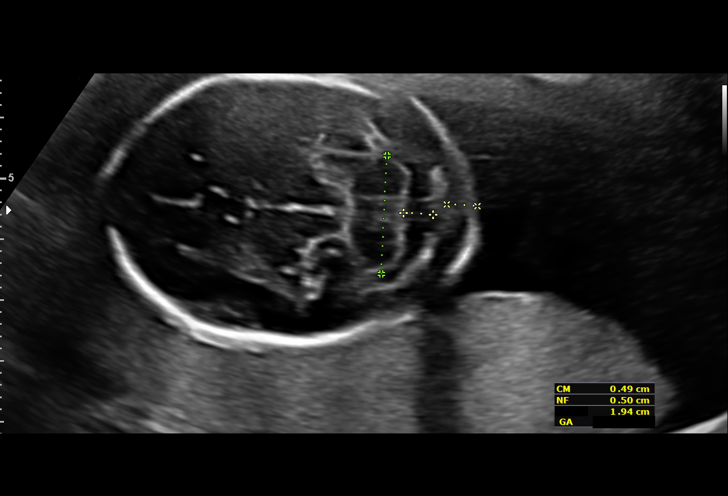
[im 38/114]
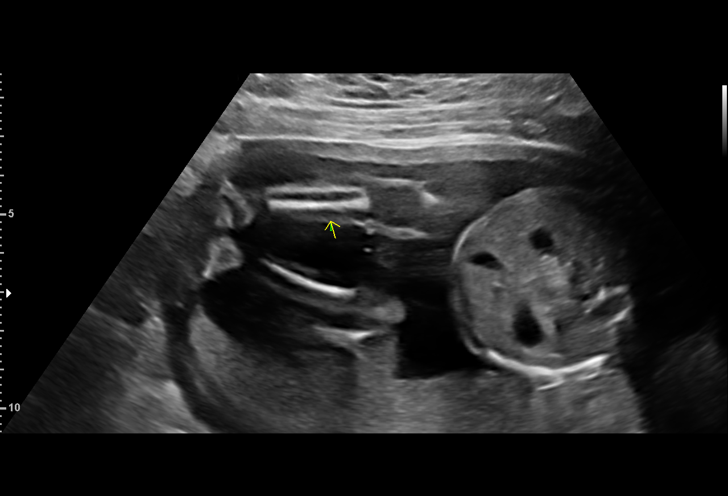
[im 47/114]
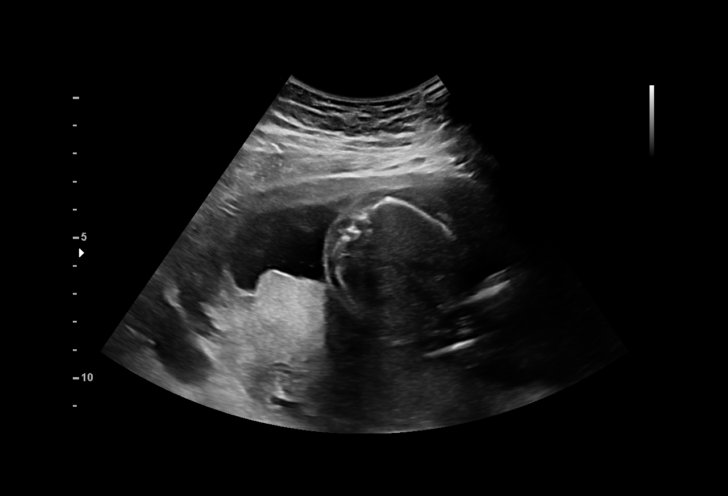
[im 59/114]
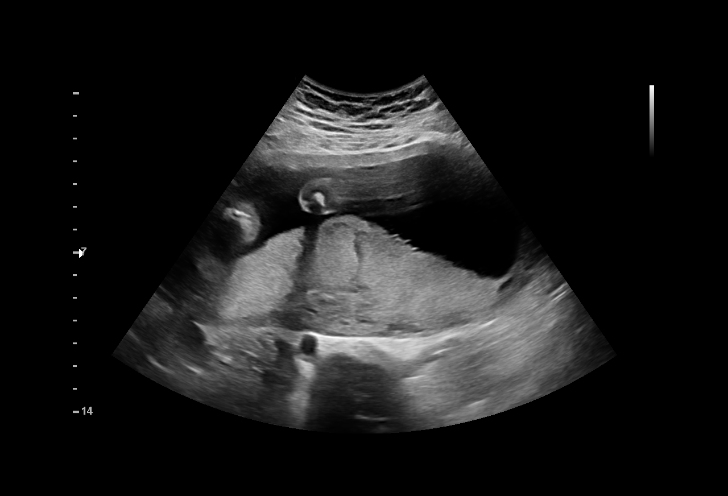
[im 67/114]
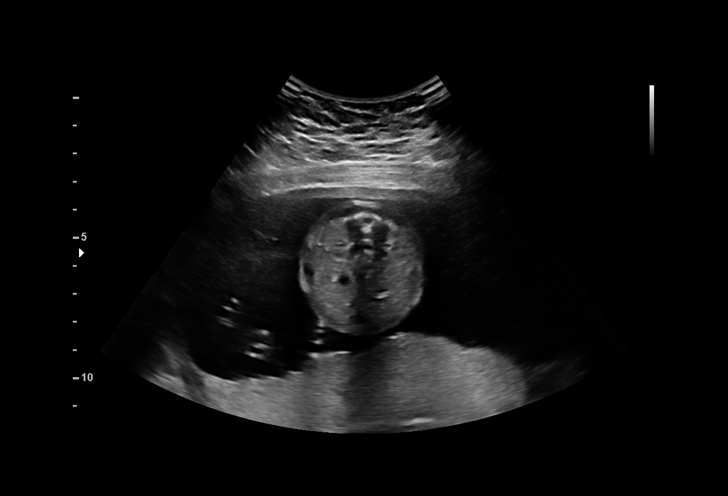
[im 76/114]
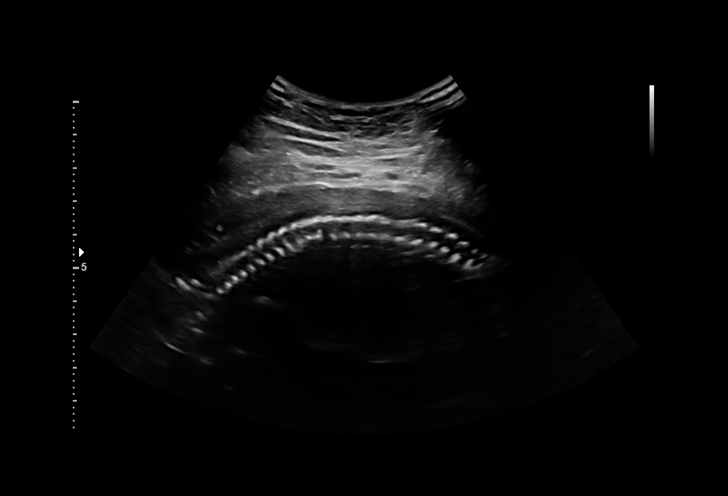
[im 84/114]
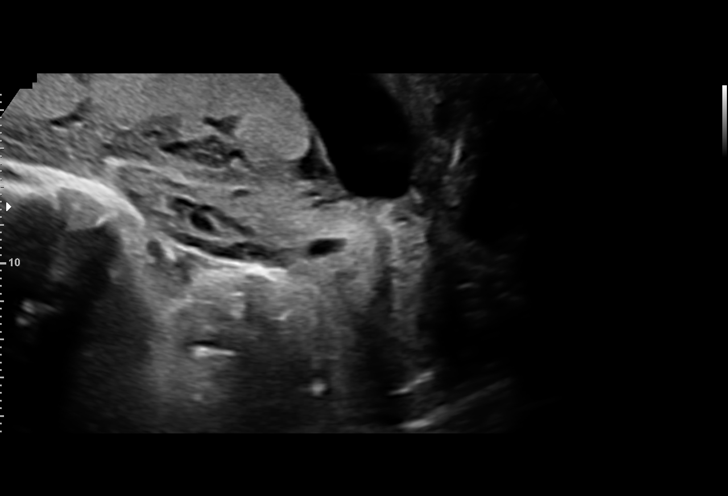
[im 93/114]
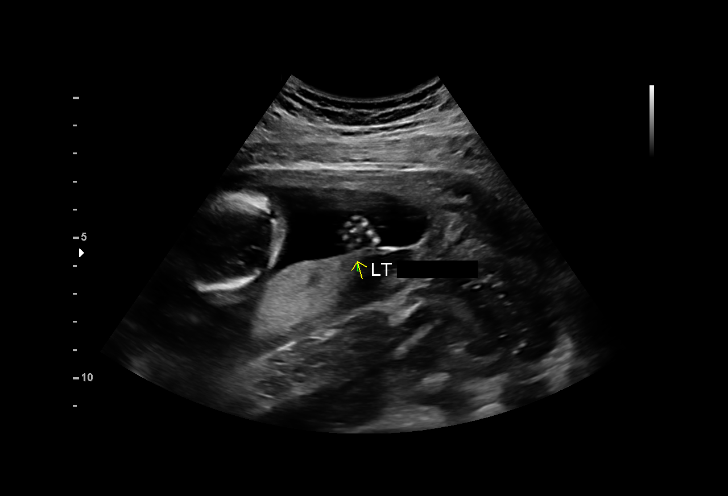
[im 101/114]
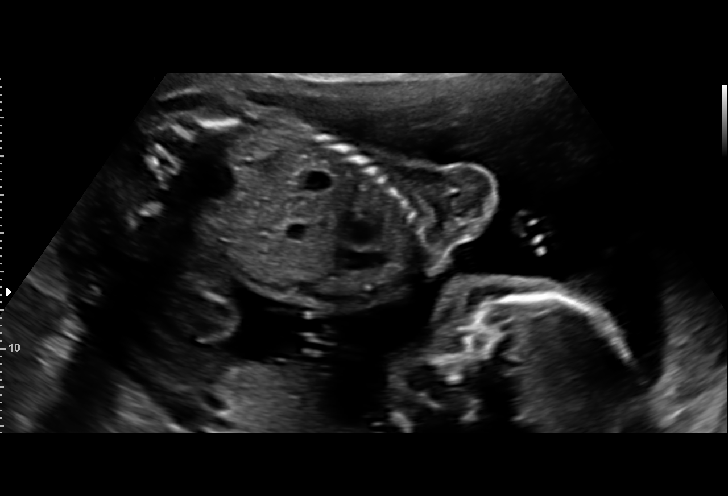
[im 109/114]
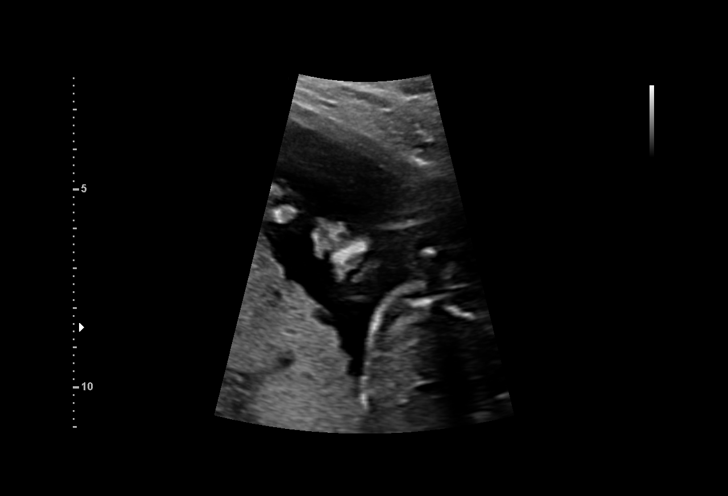

[13 of 28 positions shown; findings below may reference images not displayed]

[facility]

1  PELETI               [id number]      [id number]     [id number]
HIDEMICHI
Indications

19 weeks gestation of pregnancy
Advanced maternal age multigravida 35+,
second trimester
History of cesarean delivery, currently
pregnant
Gestational diabetes in pregnancy, diet
controlled, second trimester
Antenatal screening for elevated
alphafetoprotein level (AFP)
OB History

Blood Type:            Height:  5'6"   Weight (lb):  181      BMI:
Gravidity:    2         Term:   1        Prem:   0        SAB:   0
TOP:          0       Ectopic:  0        Living: 1
Fetal Evaluation

Num Of Fetuses:     1
Fetal Heart         161
Rate(bpm):
Cardiac Activity:   Observed
Presentation:       Transverse, head to maternal right
Placenta:           Posterior, low-lying, 0.8cm from int os

Amniotic Fluid
AFI FV:      Subjectively within normal limits

Largest Pocket(cm)
4.11
Biometry

BPD:      43.2  mm     G. Age:  19w 0d         41  %    CI:        69.01   %   70 - 86
FL/HC:      18.9   %   16.1 -
HC:      166.1  mm     G. Age:  19w 2d         44  %    HC/AC:      1.04       1.09 -
AC:      159.5  mm     G. Age:  21w 0d         92  %    FL/BPD:     72.7   %
FL:       31.4  mm     G. Age:  19w 5d         60  %    FL/AC:      19.7   %   20 - 24
HUM:      29.7  mm     G. Age:  19w 5d         63  %
CER:      19.5  mm     G. Age:  18w 5d         37  %
NFT:         5  mm

CM:        4.9  mm

Est. FW:     345  gm    0 lb 12 oz      61  %
Gestational Age

LMP:           19w 2d       Date:   08/25/16                 EDD:   06/01/17
U/S Today:     19w 5d                                        EDD:   05/29/17
Best:          19w 2d    Det. By:   LMP  (08/25/16)          EDD:   06/01/17
Anatomy

Cranium:               Appears normal         Aortic Arch:            Appears normal
Cavum:                 Appears normal         Ductal Arch:            Appears normal
Ventricles:            Appears normal         Diaphragm:              Appears normal
Choroid Plexus:        Appears normal         Stomach:                Appears normal, left
sided
Cerebellum:            Appears normal         Abdomen:                Appears normal
Posterior Fossa:       Appears normal         Abdominal Wall:         Appears nml (cord
insert, abd wall)
Nuchal Fold:           Appears normal         Cord Vessels:           Appears normal (3
vessel cord)
Face:                  Orbits nl; profile not Kidneys:                Appear normal
seen well
Lips:                  Appears normal         Bladder:                Appears normal
Thoracic:              Appears normal         Spine:                  Appears normal
Heart:                 Not well visualized    Upper Extremities:      Appears normal;
hands nsw
RVOT:                  Not well visualized    Lower Extremities:      Appears normal
LVOT:                  Not well visualized

Other:  Male gender. Technically difficult due to fetal position. Heels
visualized.
Cervix Uterus Adnexa

Cervix
Length:           5.31  cm.
Normal appearance by transabdominal scan.

Uterus
No abnormality visualized.

Left Ovary
Not visualized.

Right Ovary
Not visualized.
Impression

Singleton intrauterine pregnancy at 19+2 weeks with AMA
and GDM
Review of the anatomy shows no sonographic markers for
aneuploidy or structural anomalies
However, cardiac evaluation should be considered
suboptimal secondary to
Amniotic fluid volume is normal with
Estimated fetal weight is 345g which is growth in the 61st
percentile
Recommendations

To see genetic counselor today
Repeat US in 4 weeks to complete anatomic survey, check
placental position, and assess growth

## 2018-06-01 ENCOUNTER — Ambulatory Visit (INDEPENDENT_AMBULATORY_CARE_PROVIDER_SITE_OTHER): Payer: Federal, State, Local not specified - PPO | Admitting: Family Medicine

## 2018-06-01 ENCOUNTER — Encounter: Payer: Self-pay | Admitting: Family Medicine

## 2018-06-01 VITALS — BP 116/68 | HR 89 | Temp 98.7°F | Ht 65.0 in | Wt 172.0 lb

## 2018-06-01 DIAGNOSIS — J029 Acute pharyngitis, unspecified: Secondary | ICD-10-CM | POA: Diagnosis not present

## 2018-06-01 DIAGNOSIS — J020 Streptococcal pharyngitis: Secondary | ICD-10-CM | POA: Diagnosis not present

## 2018-06-01 LAB — POCT RAPID STREP A (OFFICE): Rapid Strep A Screen: POSITIVE — AB

## 2018-06-01 MED ORDER — CLINDAMYCIN HCL 300 MG PO CAPS: 300.0000 mg | ORAL_CAPSULE | Freq: Three times a day (TID) | ORAL | 0 refills | Status: DC

## 2018-06-01 NOTE — Patient Instructions (Signed)
Thank you for coming in today. Continue over the counter medicine.  Take clindamycin 3x daily for 7 days.  Recheck as needed.    Strep Throat Strep throat is an infection of the throat. It is caused by germs. Strep throat spreads from person to person because of coughing, sneezing, or close contact. Follow these instructions at home: Medicines  Take over-the-counter and prescription medicines only as told by your doctor.  Take your antibiotic medicine as told by your doctor. Do not stop taking the medicine even if you feel better.  Have family members who also have a sore throat or fever go to a doctor. Eating and drinking  Do not share food, drinking cups, or personal items.  Try eating soft foods until your sore throat feels better.  Drink enough fluid to keep your pee (urine) clear or pale yellow. General instructions  Rinse your mouth (gargle) with a salt-water mixture 3-4 times per day or as needed. To make a salt-water mixture, stir -1 tsp of salt into 1 cup of warm water.  Make sure that all people in your house wash their hands well.  Rest.  Stay home from school or work until you have been taking antibiotics for 24 hours.  Keep all follow-up visits as told by your doctor. This is important. Contact a doctor if:  Your neck keeps getting bigger.  You get a rash, cough, or earache.  You cough up thick liquid that is green, yellow-brown, or bloody.  You have pain that does not get better with medicine.  Your problems get worse instead of getting better.  You have a fever. Get help right away if:  You throw up (vomit).  You get a very bad headache.  You neck hurts or it feels stiff.  You have chest pain or you are short of breath.  You have drooling, very bad throat pain, or changes in your voice.  Your neck is swollen or the skin gets red and tender.  Your mouth is dry or you are peeing less than normal.  You keep feeling more tired or it is hard  to wake up.  Your joints are red or they hurt. This information is not intended to replace advice given to you by your health care provider. Make sure you discuss any questions you have with your health care provider. Document Released: 01/28/2008 Document Revised: 04/09/2016 Document Reviewed: 12/04/2014 Elsevier Interactive Patient Education  [employer].

## 2018-06-01 NOTE — Progress Notes (Signed)
[name] [name] is a 37 y.o. female who presents to [facility]: Primary Care Sports Medicine today for sore throat.   She first noticed her sore throat yesterday. She says her 23 year old son is currently sick with strep throat and she is worried that she could have it as well. She notes she has had a fever to 101. She denies cough, congestion or any other symptoms. She has taken tylenol which has helped a bit but she is still in pain and feverish. She is requesting a strep test today.    ROS as above:  Exam: or  BP 116/68   Pulse 89   Temp 98.7 F (37.1 C) (Oral)   Ht 5\' 5"  (1.651 m)   Wt 172 lb (78 kg)   BMI 28.62 kg/m  Wt Readings from Last 5 Encounters:  06/01/18 172 lb (78 kg)  04/22/18 175 lb (79.4 kg)  05/23/17 196 lb (88.9 kg)  02/09/17 182 lb (82.6 kg)  01/07/17 181 lb (82.1 kg)    Gen: Well NAD HEENT: EOMI,  MMM Throat: swollen erythematous tonsils and oropharynx. petechia and white exudates present. Bilateral anterior chain cervical lymphadenopathy  Lungs: Normal work of breathing. CTABL Heart: RRR no MRG Abd: NABS, Soft. Nondistended, Nontender Exts: Brisk capillary refill, warm and well perfused.   Lab and Radiology Results Results for orders placed or performed in visit on 06/01/18 (from the past 72 hour(s))  POCT rapid strep A     Status: Abnormal   Collection Time: 06/01/18  8:20 AM  Result Value Ref Range   Rapid Strep A Screen Positive (A) Negative   No results found.    Assessment and Plan: 37 y.o. female with a sore throat secondary to strep throat. She has a Centor score of 4 (lymphadenopathy, fever, exudates, no cough) and was given a rapid strep test which came back positive. The plan will be to complete a 7 day course of clindamycin. Clindamycin was chosen given her cephalosporin allergy. She was also advised to continue to take tylenol and that she should  start to feel better in a couple days. If she worsens or does not improve she should return to clinic.    Orders Placed This Encounter  Procedures  . POCT rapid strep A   Meds ordered this encounter  Medications  . clindamycin (CLEOCIN) 300 MG capsule    Sig: Take 1 capsule (300 mg total) by mouth 3 (three) times daily.    Dispense:  21 capsule    Refill:  0     Historical information moved to improve visibility of documentation.  Past Medical History:  Diagnosis Date  . Gestational diabetes    Past Surgical History:  Procedure Laterality Date  . CERVICAL BIOPSY  W/ LOOP ELECTRODE EXCISION     2001  . CESAREAN SECTION  07/26/2012   Procedure: CESAREAN SECTION;  Surgeon: Serita Kyle, MD;  Location: [facility];  Service: Obstetrics;  Laterality: N/A;  Primary Cesarean Section Delivery Baby Girl @  [phone], Apgars 8/9   . CESAREAN SECTION N/A 05/24/2017   Procedure: CESAREAN SECTION;  Surgeon: Maxie Better, MD;  Location: [facility] BIRTHING SUITES;  Service: Obstetrics;  Laterality: N/A;  . WISDOM TOOTH EXTRACTION     Social History   Tobacco Use  . Smoking status: Never Smoker  . Smokeless tobacco: Never Used  Substance Use Topics  . Alcohol use: Not Currently   family history includes Arthritis  in her mother; COPD in her father and mother; Colon cancer in her maternal grandmother; Deep vein thrombosis in her father; Diabetes in her father; Hepatitis C in her father; Hypertension in her father; Lung cancer in her father; Peripheral vascular disease in her father; Skin cancer in her mother.  Medications: Current Outpatient Medications  Medication Sig Dispense Refill  . BIOTIN PO Take 1 tablet by mouth daily.     Marland Kitchen CAMILA 0.35 MG tablet Take 1 tablet by mouth daily.  3  . clindamycin (CLEOCIN) 300 MG capsule Take 1 capsule (300 mg total) by mouth 3 (three) times daily. 21 capsule 0  . ferrous sulfate 325 (65 FE) MG tablet Take 1 tablet (325 mg total) by mouth 2 (two) times  daily with a meal. 60 tablet 3  . lactobacillus acidophilus (BACID) TABS tablet Take 1 tablet by mouth daily.     . Multiple Vitamins-Minerals (MULTIVITAMIN ADULT PO) Take by mouth.    . Prenatal Vit-Fe Fumarate-FA (PRENATAL VITAMIN PO) Take 1 tablet by mouth daily.      No current facility-administered medications for this visit.    Allergies  Allergen Reactions  . Ceclor [Cefaclor] Rash  . Latex Rash  . Sulfa Antibiotics Rash    Childhood reaction     Discussed warning signs or symptoms. Please see discharge instructions. Patient expresses understanding.  I personally was present and performed or re-performed the history, physical exam and medical decision-making activities of this service and have verified that the service and findings are accurately documented in the student's note. ___________________________________________ Clementeen Graham M.D., ABFM., CAQSM. Primary Care and Sports Medicine Adjunct Instructor of Family Medicine  [employer]

## 2018-06-22 IMAGING — US US MFM OB FOLLOW-UP
1 series · 14 of 28 positions shown · non-contrast
Comparison: none

[Series 1: us mfm ob follow-up · 61 acquisitions, 14 frames shown]
[im 3/61]
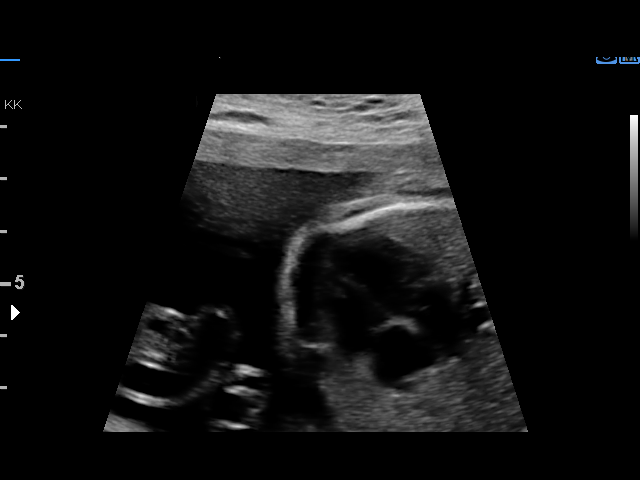
[im 7/61]
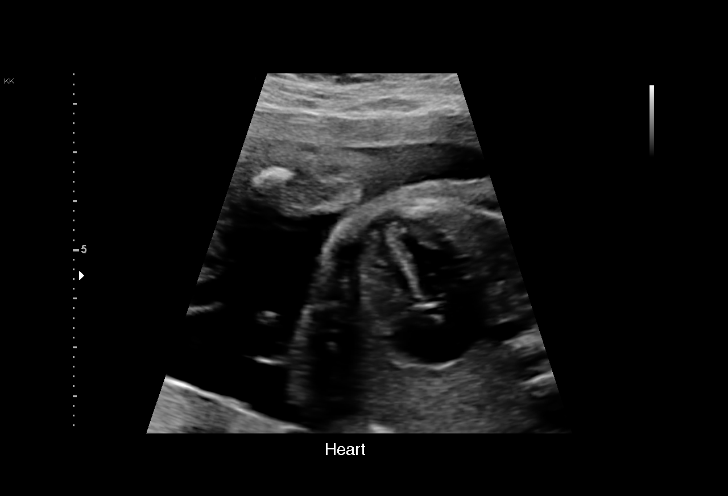
[im 12/61]
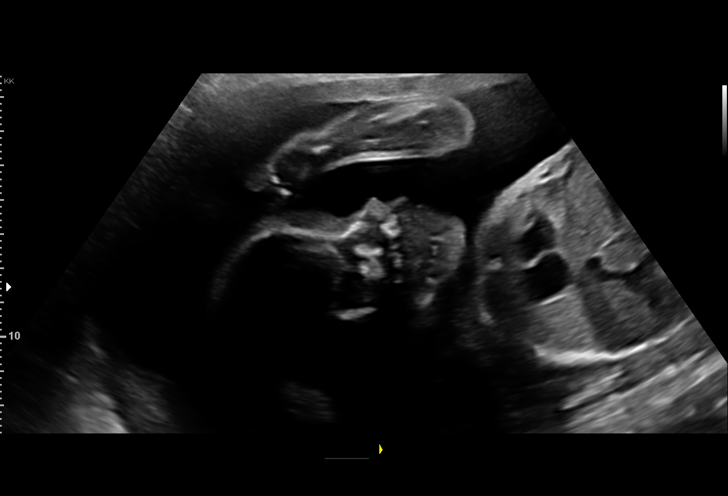
[im 16/61]
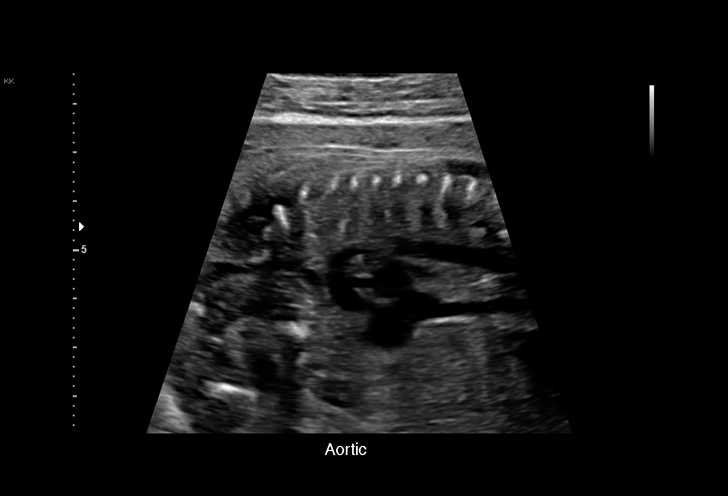
[im 21/61]
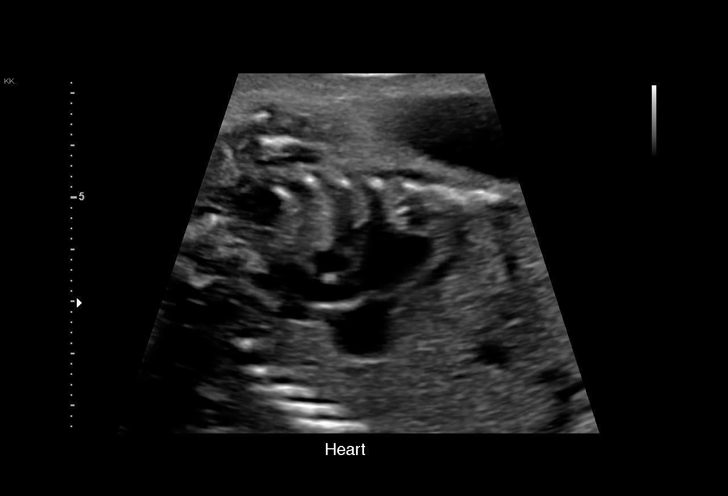
[im 25/61]
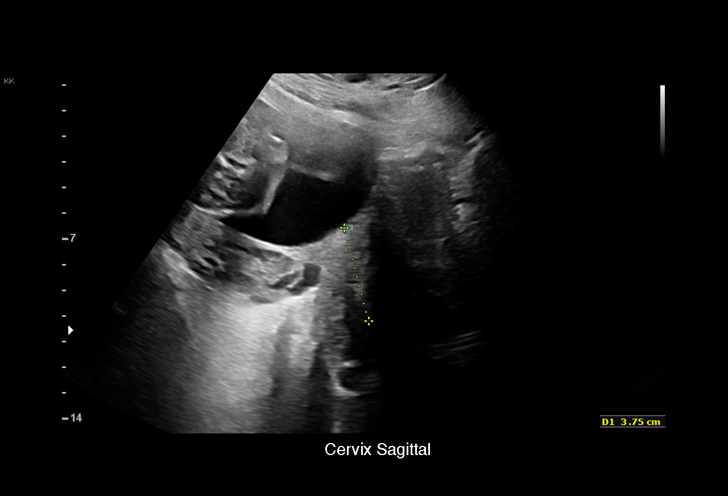
[im 29/61]
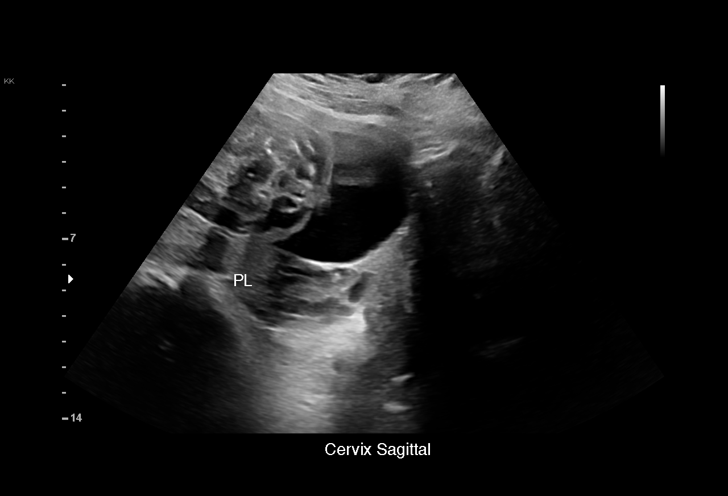
[im 34/61]
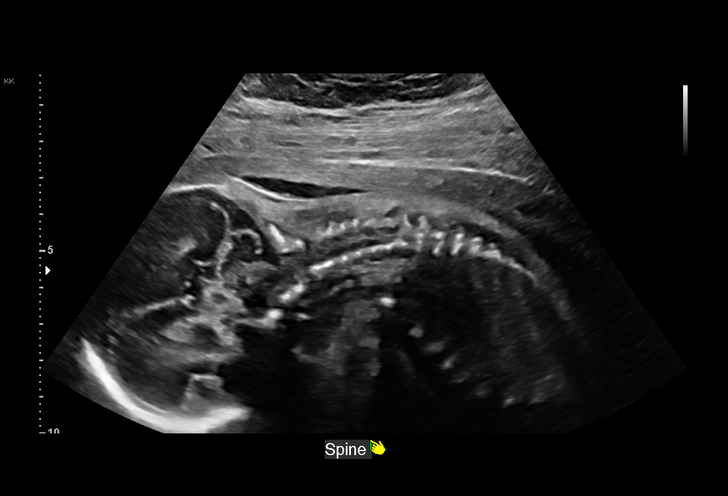
[im 38/61]
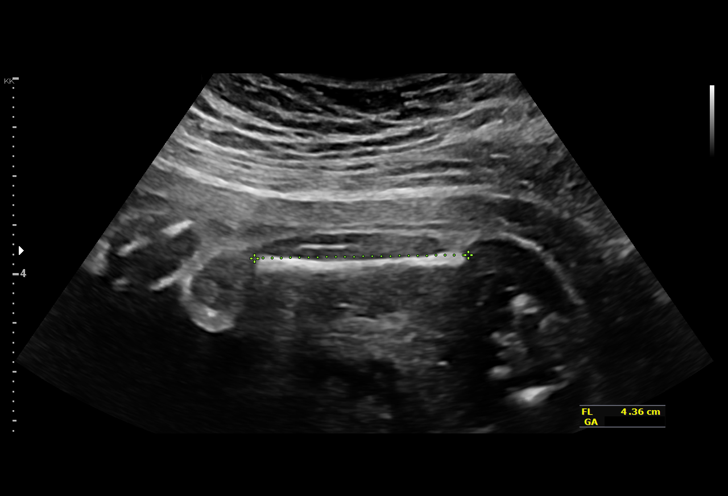
[im 43/61]
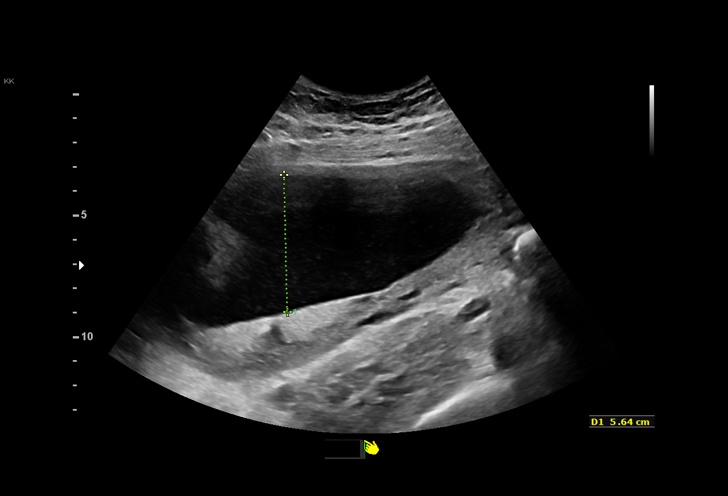
[im 47/61]
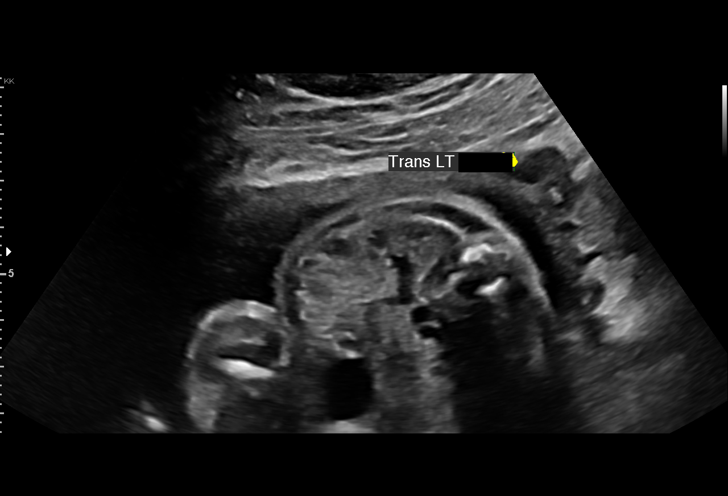
[im 52/61]
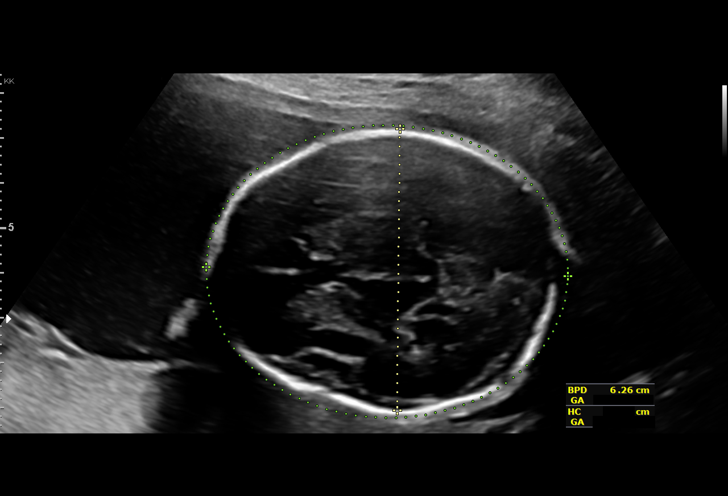
[im 56/61]
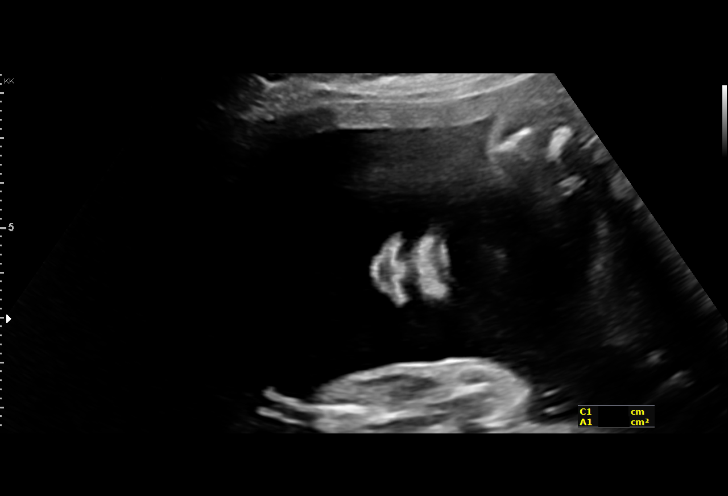
[im 61/61]
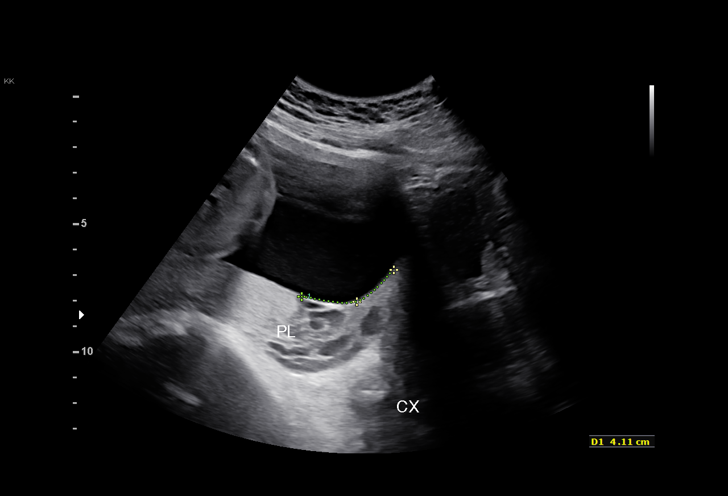

[14 of 28 positions shown; findings below may reference images not displayed]

[facility]

1  JHON MARTA              [id number]      [id number]     [id number]
Indications

24 weeks gestation of pregnancy
Advanced maternal age multigravida 35+,
second trimester; low risk NIPS
History of cesarean delivery, currently
pregnant
Gestational diabetes in pregnancy, diet
controlled, second trimester; normal fetal
ECHO on [DATE] Dr. Valcin
Encounter for other antenatal screening
follow-up
Abnormal biochemical finding on antenatal
screening of mother - elevated MSAFP (2.53
MoM)
OB History

Blood Type:            Height:  5'6"   Weight (lb):  181      BMI:
Gravidity:    2         Term:   1        Prem:   0        SAB:   0
TOP:          0       Ectopic:  0        Living: 1
Fetal Evaluation

Num Of Fetuses:     1
Fetal Heart         142
Rate(bpm):
Cardiac Activity:   Observed
Presentation:       Breech
Placenta:           Posterior, above cervical os
P. Cord Insertion:  Not well visualized
Amniotic Fluid
AFI FV:      Subjectively within normal limits

Largest Pocket(cm)
5.6
Biometry

BPD:      62.8  mm     G. Age:  25w 3d         89  %    CI:        73.69   %   70 - 86
FL/HC:      18.8   %   18.7 -
HC:      232.4  mm     G. Age:  25w 2d         78  %    HC/AC:      1.11       1.05 -
AC:      209.6  mm     G. Age:  25w 4d         84  %    FL/BPD:     69.4   %   71 - 87
FL:       43.6  mm     G. Age:  24w 2d         48  %    FL/AC:      20.8   %   20 - 24
Est. FW:     764  gm    1 lb 11 oz      72  %
Gestational Age

LMP:           24w 0d       Date:   08/25/16                 EDD:   06/01/17
U/S Today:     25w 1d                                        EDD:   05/24/17
Best:          24w 0d    Det. By:   LMP  (08/25/16)          EDD:   06/01/17
Anatomy

Cranium:               Appears normal         Aortic Arch:            Appears normal
Cavum:                 Appears normal         Ductal Arch:            Previously seen
Ventricles:            Appears normal         Diaphragm:              Appears normal
Choroid Plexus:        Previously seen        Stomach:                Appears normal, left
sided
Cerebellum:            Previously seen        Abdomen:                Appears normal
Posterior Fossa:       Previously seen        Abdominal Wall:         Previously seen
Nuchal Fold:           Appears normal         Cord Vessels:           Previously seen
Face:                  Orbits and profile     Kidneys:                Previously seen
previously seen
Lips:                  Appears normal         Bladder:                Appears normal
Thoracic:              Appears normal         Spine:                  Previously seen
Heart:                 Appears normal         Upper Extremities:      Previously seen
(4CH, axis, and situs
RVOT:                  Appears normal         Lower Extremities:      Previously seen
LVOT:                  Not well visualized

Other:  Male gender. Technically difficult due to fetal position. Heels
previously visualized.
Cervix Uterus Adnexa

Cervix
Length:            3.7  cm.
Normal appearance by transabdominal scan.
Impression

SIUP at 24+0 weeks
Normal interval anatomy; anatomic survey complete except
for LVOT
Normal amniotic fluid volume
Appropriate interval growth with EFW at the 72nd %tile
Recommendations

Follow-up ultrasound for growth in the early third trimester
due to the unexplained elevated MSAFP

## 2018-08-26 ENCOUNTER — Encounter: Payer: Self-pay | Admitting: Physician Assistant

## 2018-09-03 DIAGNOSIS — K080 Exfoliation of teeth due to systemic causes: Secondary | ICD-10-CM | POA: Diagnosis not present

## 2018-09-20 DIAGNOSIS — Z01419 Encounter for gynecological examination (general) (routine) without abnormal findings: Secondary | ICD-10-CM | POA: Diagnosis not present

## 2018-09-20 DIAGNOSIS — Z6829 Body mass index (BMI) 29.0-29.9, adult: Secondary | ICD-10-CM | POA: Diagnosis not present

## 2018-09-20 DIAGNOSIS — Z1151 Encounter for screening for human papillomavirus (HPV): Secondary | ICD-10-CM | POA: Diagnosis not present

## 2018-10-01 DIAGNOSIS — K080 Exfoliation of teeth due to systemic causes: Secondary | ICD-10-CM | POA: Diagnosis not present

## 2018-12-28 ENCOUNTER — Telehealth: Payer: Self-pay

## 2018-12-28 ENCOUNTER — Encounter: Payer: Self-pay | Admitting: Physician Assistant

## 2018-12-28 ENCOUNTER — Ambulatory Visit (INDEPENDENT_AMBULATORY_CARE_PROVIDER_SITE_OTHER): Payer: Federal, State, Local not specified - PPO | Admitting: Physician Assistant

## 2018-12-28 VITALS — BP 151/79 | HR 96 | Temp 100.3°F | Wt 171.0 lb

## 2018-12-28 DIAGNOSIS — R509 Fever, unspecified: Secondary | ICD-10-CM

## 2018-12-28 DIAGNOSIS — J029 Acute pharyngitis, unspecified: Secondary | ICD-10-CM

## 2018-12-28 MED ORDER — PENICILLIN V POTASSIUM 500 MG PO TABS: 500.0000 mg | ORAL_TABLET | Freq: Two times a day (BID) | ORAL | 0 refills | Status: AC

## 2018-12-28 NOTE — Progress Notes (Signed)
Virtual Visit via Video Note  I connected with [name] [name] on 12/28/18 at  1:40 PM EDT by a video enabled telemedicine application and verified that I am speaking with the correct person using two identifiers.   I discussed the limitations of evaluation and management by telemedicine and the availability of in person appointments. The patient expressed understanding and agreed to proceed.  History of Present Illness: HPI:                                                                [name] [name] is a 38 y.o. female   CC: fever, sore throat  Sudden onset fever 101, chills, fatigue, myalgia last night. She now has a sore throat and a dry cough. She has noticed white spots on her tonsils and has some tenderness in her lymph nodes. No dysphagia, drooling or voice change. Son has been attending daycare and has a cough, but no fever.   Past Medical History:  Diagnosis Date  . Gestational diabetes    Past Surgical History:  Procedure Laterality Date  . CERVICAL BIOPSY  W/ LOOP ELECTRODE EXCISION     2001  . CESAREAN SECTION  07/26/2012   Procedure: CESAREAN SECTION;  Surgeon: Serita Kyle, MD;  Location: [facility];  Service: Obstetrics;  Laterality: N/A;  Primary Cesarean Section Delivery Baby Girl @  [phone], Apgars 8/9   . CESAREAN SECTION N/A 05/24/2017   Procedure: CESAREAN SECTION;  Surgeon: Maxie Better, MD;  Location: [facility] BIRTHING SUITES;  Service: Obstetrics;  Laterality: N/A;  . WISDOM TOOTH EXTRACTION     Social History   Tobacco Use  . Smoking status: Never Smoker  . Smokeless tobacco: Never Used  Substance Use Topics  . Alcohol use: Not Currently   family history includes Arthritis in her mother; COPD in her father and mother; Colon cancer in her maternal grandmother; Deep vein thrombosis in her father; Diabetes in her father; Hepatitis C in her father; Hypertension in her father; Lung cancer in her father; Peripheral vascular disease in her father; Skin cancer  in her mother.    ROS: negative except as noted in the HPI  Medications: Current Outpatient Medications  Medication Sig Dispense Refill  . CAMILA 0.35 MG tablet Take 1 tablet by mouth daily.  3  . ferrous sulfate 325 (65 FE) MG tablet Take 1 tablet (325 mg total) by mouth 2 (two) times daily with a meal. 60 tablet 3  . lactobacillus acidophilus (BACID) TABS tablet Take 1 tablet by mouth daily.     Marland Kitchen BIOTIN PO Take 1 tablet by mouth daily.     . Multiple Vitamins-Minerals (MULTIVITAMIN ADULT PO) Take by mouth.    . Prenatal Vit-Fe Fumarate-FA (PRENATAL VITAMIN PO) Take 1 tablet by mouth daily.      No current facility-administered medications for this visit.    Allergies  Allergen Reactions  . Ceclor [Cefaclor] Rash  . Latex Rash  . Sulfa Antibiotics Rash    Childhood reaction       Objective:  BP (!) 151/79   Pulse 96   Temp 100.3 F (37.9 C) (Oral)   Wt 171 lb (77.6 kg)   SpO2 96%   BMI 28.46 kg/m  Gen:  alert, not ill-appearing, no  distress, appropriate for age HEENT: head normocephalic without obvious abnormality, conjunctiva and cornea clear, trachea midline Pulm: Normal work of breathing, normal phonation, speaking in full sentences Neuro: alert and oriented x 3    Assessment and Plan: 38 y.o. female with   .Diagnoses and all orders for this visit:  Acute febrile illness  Sore throat -     penicillin v potassium (VEETID) 500 MG tablet; Take 1 tablet (500 mg total) by mouth 2 (two) times a day for 10 days.   Vitals reviewed Febrile at 100.3, hypertensive 151/79, no tachycardia, pulse ox 96% on RA at rest Centor score=3 Treating empirically for Strep pharyngitis Close follow-up in 2 days If no clinical improvement, patient will return for drive-up POC influenza and COVID-19 testing   Follow Up Instructions:    I discussed the assessment and treatment plan with the patient. The patient was provided an opportunity to ask questions and all were  answered. The patient agreed with the plan and demonstrated an understanding of the instructions.   The patient was advised to call back or seek an in-person evaluation if the symptoms worsen or if the condition fails to improve as anticipated.  I provided 15 minutes of non-face-to-face time during this encounter.   Carlis Stableharley Elizabeth Donnald [name], [place]

## 2018-12-28 NOTE — Telephone Encounter (Signed)
Pt left msg with after hours stating that she has a low grade fever, chills, sore throat, fatigue, and body aches. States SX came out of nowhere. Wanting to know if she should continue taking kid to daycare.   Scheduled for virtual visit with Gena Fray today

## 2018-12-29 ENCOUNTER — Encounter: Payer: Self-pay | Admitting: Physician Assistant

## 2018-12-30 ENCOUNTER — Ambulatory Visit (INDEPENDENT_AMBULATORY_CARE_PROVIDER_SITE_OTHER): Payer: Federal, State, Local not specified - PPO | Admitting: Physician Assistant

## 2018-12-30 ENCOUNTER — Encounter: Payer: Self-pay | Admitting: Physician Assistant

## 2018-12-30 VITALS — BP 143/75 | HR 68

## 2018-12-30 DIAGNOSIS — R030 Elevated blood-pressure reading, without diagnosis of hypertension: Secondary | ICD-10-CM | POA: Diagnosis not present

## 2018-12-30 DIAGNOSIS — J029 Acute pharyngitis, unspecified: Secondary | ICD-10-CM

## 2018-12-30 NOTE — Progress Notes (Signed)
Virtual Visit via Telephone Note  I connected with [name] [name] on 12/30/18 at  9:50 AM EDT by telephone and verified that I am speaking with the correct person using two identifiers.   I discussed the limitations, risks, security and privacy concerns of performing an evaluation and management service by telephone and the availability of in person appointments. I also discussed with the patient that there may be a patient responsible charge related to this service. The patient expressed understanding and agreed to proceed.   History of Present Illness: HPI:                                                                [name] [name] is a 38 y.o. female   CC: sore throat follow-up  [name] [name] presented 2 days ago with sudden onset fever 101, chills, fatigue, myalgia last night. She now has a sore throat and a dry cough. She has noticed white spots on her tonsils and has some tenderness in her lymph nodes. No dysphagia, drooling or voice change. Son has been attending daycare and has a cough, but no fever. She was treated empirically for Strep pharyngitis (Centor score 3) with PCN.  Feeling improved since starting PCN. Has not had any additional fever. Fatigue and sore throat are improved. Having a slightly productive cough. No SOB or CP.    Past Medical History:  Diagnosis Date  . Gestational diabetes    Past Surgical History:  Procedure Laterality Date  . CERVICAL BIOPSY  W/ LOOP ELECTRODE EXCISION     2001  . CESAREAN SECTION  07/26/2012   Procedure: CESAREAN SECTION;  Surgeon: Serita KyleSheronette A Cousins, MD;  Location: [facility];  Service: Obstetrics;  Laterality: N/A;  Primary Cesarean Section Delivery Baby Girl @  [phone], Apgars 8/9   . CESAREAN SECTION N/A 05/24/2017   Procedure: CESAREAN SECTION;  Surgeon: Maxie Betterousins, Sheronette, MD;  [facility];  Service: Obstetrics;  Laterality: N/A;  . WISDOM TOOTH EXTRACTION     Social History   Tobacco Use  . Smoking status: Never  Smoker  . Smokeless tobacco: Never Used  Substance Use Topics  . Alcohol use: Not Currently   family history includes Arthritis in her mother; COPD in her father and mother; Colon cancer in her maternal grandmother; Deep vein thrombosis in her father; Diabetes in her father; Hepatitis C in her father; Hypertension in her father; Lung cancer in her father; Peripheral vascular disease in her father; Skin cancer in her mother.    ROS: negative except as noted in the HPI  Medications: Current Outpatient Medications  Medication Sig Dispense Refill  . BIOTIN PO Take 1 tablet by mouth daily.     Marland Kitchen. CAMILA 0.35 MG tablet Take 1 tablet by mouth daily.  3  . ferrous sulfate 325 (65 FE) MG tablet Take 1 tablet (325 mg total) by mouth 2 (two) times daily with a meal. 60 tablet 3  . lactobacillus acidophilus (BACID) TABS tablet Take 1 tablet by mouth daily.     . Multiple Vitamins-Minerals (MULTIVITAMIN ADULT PO) Take by mouth.    . penicillin v potassium (VEETID) 500 MG tablet Take 1 tablet (500 mg total) by mouth 2 (two) times a day for 10 days. 20 tablet 0  .  Prenatal Vit-Fe Fumarate-FA (PRENATAL VITAMIN PO) Take 1 tablet by mouth daily.      No current facility-administered medications for this visit.    Allergies  Allergen Reactions  . Ceclor [Cefaclor] Rash  . Latex Rash  . Sulfa Antibiotics Rash    Childhood reaction       Objective:  BP (!) 173/123   Pulse 68    BP Readings from Last 3 Encounters:  12/30/18 (!) 173/123  12/28/18 (!) 151/79  06/01/18 116/68      Assessment and Plan: 38 y.o. female with   .[name] [name] was seen today for follow-up.  Diagnoses and all orders for this visit:  Acute sore throat  Elevated blood pressure reading  Elevated blood pressure reading Home BP reading is severely elevated, improved on recheck, patient is asymptomatic and feels reading is falsely elevated due to inaccurate wrist cuff with possibly low battery   Acute sore  throat Clinically improved with PCN Complete full 10 day course of ABX Return precautions discussed   Follow Up Instructions:    I discussed the assessment and treatment plan with the patient. The patient was provided an opportunity to ask questions and all were answered. The patient agreed with the plan and demonstrated an understanding of the instructions.   The patient was advised to call back or seek an in-person evaluation if the symptoms worsen or if the condition fails to improve as anticipated.  I provided 5-10 minutes of non-face-to-face time during this encounter.   Carlis Stable, [place]

## 2019-03-16 DIAGNOSIS — K080 Exfoliation of teeth due to systemic causes: Secondary | ICD-10-CM | POA: Diagnosis not present

## 2019-04-14 DIAGNOSIS — Z20828 Contact with and (suspected) exposure to other viral communicable diseases: Secondary | ICD-10-CM | POA: Diagnosis not present

## 2019-10-18 DIAGNOSIS — Z20828 Contact with and (suspected) exposure to other viral communicable diseases: Secondary | ICD-10-CM | POA: Diagnosis not present

## 2019-10-18 DIAGNOSIS — Z03818 Encounter for observation for suspected exposure to other biological agents ruled out: Secondary | ICD-10-CM | POA: Diagnosis not present

## 2020-02-13 DIAGNOSIS — Z8632 Personal history of gestational diabetes: Secondary | ICD-10-CM | POA: Diagnosis not present

## 2020-02-18 DIAGNOSIS — Z20822 Contact with and (suspected) exposure to covid-19: Secondary | ICD-10-CM | POA: Diagnosis not present

## 2020-03-14 DIAGNOSIS — Z0000 Encounter for general adult medical examination without abnormal findings: Secondary | ICD-10-CM | POA: Diagnosis not present

## 2020-03-14 DIAGNOSIS — D492 Neoplasm of unspecified behavior of bone, soft tissue, and skin: Secondary | ICD-10-CM | POA: Diagnosis not present

## 2020-03-14 DIAGNOSIS — Z13220 Encounter for screening for lipoid disorders: Secondary | ICD-10-CM | POA: Diagnosis not present

## 2020-03-14 DIAGNOSIS — E785 Hyperlipidemia, unspecified: Secondary | ICD-10-CM | POA: Diagnosis not present

## 2020-03-14 DIAGNOSIS — Z6827 Body mass index (BMI) 27.0-27.9, adult: Secondary | ICD-10-CM | POA: Diagnosis not present

## 2020-04-02 DIAGNOSIS — L814 Other melanin hyperpigmentation: Secondary | ICD-10-CM | POA: Diagnosis not present

## 2020-04-02 DIAGNOSIS — D225 Melanocytic nevi of trunk: Secondary | ICD-10-CM | POA: Diagnosis not present

## 2020-04-02 DIAGNOSIS — D485 Neoplasm of uncertain behavior of skin: Secondary | ICD-10-CM | POA: Diagnosis not present

## 2020-04-02 DIAGNOSIS — L578 Other skin changes due to chronic exposure to nonionizing radiation: Secondary | ICD-10-CM | POA: Diagnosis not present

## 2020-04-19 DIAGNOSIS — Z3201 Encounter for pregnancy test, result positive: Secondary | ICD-10-CM | POA: Diagnosis not present

## 2020-05-01 ENCOUNTER — Other Ambulatory Visit: Payer: Self-pay | Admitting: Obstetrics and Gynecology

## 2020-05-01 DIAGNOSIS — Z3200 Encounter for pregnancy test, result unknown: Secondary | ICD-10-CM

## 2020-05-04 ENCOUNTER — Ambulatory Visit (INDEPENDENT_AMBULATORY_CARE_PROVIDER_SITE_OTHER): Payer: Federal, State, Local not specified - PPO

## 2020-05-04 ENCOUNTER — Other Ambulatory Visit: Payer: Self-pay

## 2020-05-04 DIAGNOSIS — Z3A01 Less than 8 weeks gestation of pregnancy: Secondary | ICD-10-CM

## 2020-05-04 DIAGNOSIS — Z3687 Encounter for antenatal screening for uncertain dates: Secondary | ICD-10-CM

## 2020-05-04 DIAGNOSIS — N854 Malposition of uterus: Secondary | ICD-10-CM | POA: Diagnosis not present

## 2020-05-04 DIAGNOSIS — Z3200 Encounter for pregnancy test, result unknown: Secondary | ICD-10-CM

## 2020-05-04 DIAGNOSIS — O0281 Inappropriate change in quantitative human chorionic gonadotropin (hCG) in early pregnancy: Secondary | ICD-10-CM | POA: Diagnosis not present

## 2020-05-14 DIAGNOSIS — O09521 Supervision of elderly multigravida, first trimester: Secondary | ICD-10-CM | POA: Diagnosis not present

## 2020-05-14 DIAGNOSIS — O09529 Supervision of elderly multigravida, unspecified trimester: Secondary | ICD-10-CM | POA: Diagnosis not present

## 2020-06-11 DIAGNOSIS — Z3481 Encounter for supervision of other normal pregnancy, first trimester: Secondary | ICD-10-CM | POA: Diagnosis not present

## 2020-06-29 DIAGNOSIS — Z361 Encounter for antenatal screening for raised alphafetoprotein level: Secondary | ICD-10-CM | POA: Diagnosis not present

## 2020-06-29 DIAGNOSIS — O09299 Supervision of pregnancy with other poor reproductive or obstetric history, unspecified trimester: Secondary | ICD-10-CM | POA: Diagnosis not present

## 2020-07-16 DIAGNOSIS — Z3689 Encounter for other specified antenatal screening: Secondary | ICD-10-CM | POA: Diagnosis not present

## 2020-07-16 DIAGNOSIS — Z3482 Encounter for supervision of other normal pregnancy, second trimester: Secondary | ICD-10-CM | POA: Diagnosis not present

## 2020-07-16 DIAGNOSIS — O24419 Gestational diabetes mellitus in pregnancy, unspecified control: Secondary | ICD-10-CM | POA: Diagnosis not present

## 2020-07-16 DIAGNOSIS — O09522 Supervision of elderly multigravida, second trimester: Secondary | ICD-10-CM | POA: Diagnosis not present

## 2020-08-14 DIAGNOSIS — O09522 Supervision of elderly multigravida, second trimester: Secondary | ICD-10-CM | POA: Diagnosis not present

## 2020-08-20 DIAGNOSIS — O24410 Gestational diabetes mellitus in pregnancy, diet controlled: Secondary | ICD-10-CM | POA: Diagnosis not present

## 2020-08-20 DIAGNOSIS — O09522 Supervision of elderly multigravida, second trimester: Secondary | ICD-10-CM | POA: Diagnosis not present

## 2020-08-20 DIAGNOSIS — Z3A23 23 weeks gestation of pregnancy: Secondary | ICD-10-CM | POA: Diagnosis not present

## 2020-11-23 ENCOUNTER — Encounter (HOSPITAL_COMMUNITY): Payer: Self-pay | Admitting: *Deleted

## 2020-11-23 ENCOUNTER — Encounter (HOSPITAL_COMMUNITY): Payer: Self-pay

## 2020-11-23 NOTE — Patient Instructions (Signed)
AMAYRANI BENNICK  11/23/2020   Your procedure is scheduled on:  12/07/2020  Arrive at 0530 at Entrance C on [address] at [facility]  and [address]. You are invited to use the FREE valet parking or use the Visitor's parking deck.  Pick up the phone at the desk and dial [phone].  Call this number if you have problems the morning of surgery: [phone]  Remember:   Do not eat food:(After Midnight) Desps de medianoche.  Do not drink clear liquids: (After Midnight) Desps de medianoche.  Take these medicines the morning of surgery with A SIP OF WATER:  none   Do not wear jewelry, make-up or nail polish.  Do not wear lotions, powders, or perfumes. Do not wear deodorant.  Do not shave 48 hours prior to surgery.  Do not bring valuables to the hospital.  [facility] is not   responsible for any belongings or valuables brought to the hospital.  Contacts, dentures or bridgework may not be worn into surgery.  Leave suitcase in the car. After surgery it may be brought to your room.  For patients admitted to the hospital, checkout time is 11:00 AM the day of              discharge.      Please read over the following fact sheets that you were given:     Preparing for Surgery

## 2020-12-05 ENCOUNTER — Encounter (HOSPITAL_COMMUNITY)
Admission: RE | Admit: 2020-12-05 | Discharge: 2020-12-05 | Disposition: A | Discharge status: Home / Self Care | Payer: Federal, State, Local not specified - PPO | Source: Ambulatory Visit | Attending: Obstetrics and Gynecology | Admitting: Obstetrics and Gynecology

## 2020-12-05 ENCOUNTER — Other Ambulatory Visit: Payer: Self-pay

## 2020-12-05 ENCOUNTER — Other Ambulatory Visit (HOSPITAL_COMMUNITY)
Admission: RE | Admit: 2020-12-05 | Discharge: 2020-12-05 | Disposition: A | Discharge status: Home / Self Care | Payer: Federal, State, Local not specified - PPO | Source: Ambulatory Visit | Attending: Obstetrics and Gynecology | Admitting: Obstetrics and Gynecology

## 2020-12-05 DIAGNOSIS — Z20822 Contact with and (suspected) exposure to covid-19: Secondary | ICD-10-CM | POA: Insufficient documentation

## 2020-12-05 DIAGNOSIS — Z01812 Encounter for preprocedural laboratory examination: Secondary | ICD-10-CM | POA: Insufficient documentation

## 2020-12-05 HISTORY — DX: Unspecified abnormal cytological findings in specimens from vagina: R87.629

## 2020-12-05 LAB — TYPE AND SCREEN
ABO/RH(D): B POS
Antibody Screen: NEGATIVE

## 2020-12-05 LAB — SARS CORONAVIRUS 2 (TAT 6-24 HRS): SARS Coronavirus 2: NEGATIVE

## 2020-12-06 ENCOUNTER — Inpatient Hospital Stay (HOSPITAL_COMMUNITY): Payer: Federal, State, Local not specified - PPO | Admitting: Anesthesiology

## 2020-12-06 ENCOUNTER — Encounter (HOSPITAL_COMMUNITY)
Admission: AD | Disposition: A | Discharge status: Home / Self Care | Payer: Self-pay | Source: Home / Self Care | Attending: Obstetrics and Gynecology

## 2020-12-06 ENCOUNTER — Encounter (HOSPITAL_COMMUNITY): Payer: Self-pay | Admitting: Obstetrics and Gynecology

## 2020-12-06 ENCOUNTER — Inpatient Hospital Stay (HOSPITAL_COMMUNITY)
Admission: AD | Admit: 2020-12-06 | Discharge: 2020-12-08 | DRG: 788 | Disposition: A | Discharge status: Home / Self Care | Payer: Federal, State, Local not specified - PPO | Attending: Obstetrics and Gynecology | Admitting: Obstetrics and Gynecology

## 2020-12-06 DIAGNOSIS — Z20822 Contact with and (suspected) exposure to covid-19: Secondary | ICD-10-CM | POA: Diagnosis present

## 2020-12-06 DIAGNOSIS — O34211 Maternal care for low transverse scar from previous cesarean delivery: Principal | ICD-10-CM | POA: Diagnosis present

## 2020-12-06 DIAGNOSIS — O9081 Anemia of the puerperium: Secondary | ICD-10-CM | POA: Diagnosis not present

## 2020-12-06 DIAGNOSIS — O24420 Gestational diabetes mellitus in childbirth, diet controlled: Secondary | ICD-10-CM | POA: Diagnosis present

## 2020-12-06 DIAGNOSIS — Z3A38 38 weeks gestation of pregnancy: Secondary | ICD-10-CM | POA: Diagnosis not present

## 2020-12-06 DIAGNOSIS — O26893 Other specified pregnancy related conditions, third trimester: Secondary | ICD-10-CM | POA: Diagnosis present

## 2020-12-06 DIAGNOSIS — O34219 Maternal care for unspecified type scar from previous cesarean delivery: Secondary | ICD-10-CM | POA: Diagnosis present

## 2020-12-06 LAB — CBC
HCT: 38 % (ref 36.0–46.0)
Hemoglobin: 12.6 g/dL (ref 12.0–15.0)
MCH: 28.5 pg (ref 26.0–34.0)
MCHC: 33.2 g/dL (ref 30.0–36.0)
MCV: 86 fL (ref 80.0–100.0)
Platelets: 181 10*3/uL (ref 150–400)
RBC: 4.42 MIL/uL (ref 3.87–5.11)
RDW: 13.2 % (ref 11.5–15.5)
WBC: 15.3 10*3/uL — ABNORMAL HIGH (ref 4.0–10.5)
nRBC: 0 % (ref 0.0–0.2)

## 2020-12-06 LAB — RPR: RPR Ser Ql: NONREACTIVE

## 2020-12-06 LAB — GLUCOSE, CAPILLARY: Glucose-Capillary: 101 mg/dL — ABNORMAL HIGH (ref 70–99)

## 2020-12-06 SURGERY — Surgical Case
Anesthesia: Spinal | Wound class: Clean Contaminated

## 2020-12-06 MED ORDER — BUPIVACAINE HCL (PF) 0.25 % IJ SOLN
INTRAMUSCULAR | Status: DC | PRN
  Administered 2020-12-06: 20 mL

## 2020-12-06 MED ORDER — MEPERIDINE HCL 25 MG/ML IJ SOLN: 6.2500 mg | INTRAMUSCULAR | Status: DC | PRN

## 2020-12-06 MED ORDER — MORPHINE SULFATE (PF) 0.5 MG/ML IJ SOLN
INTRAMUSCULAR | Status: DC | PRN
  Administered 2020-12-06: .15 mg via INTRATHECAL

## 2020-12-06 MED ORDER — DIBUCAINE (PERIANAL) 1 % EX OINT: 1.0000 "application " | TOPICAL_OINTMENT | CUTANEOUS | Status: DC | PRN

## 2020-12-06 MED ORDER — SODIUM CHLORIDE 0.9 % IV SOLN: INTRAVENOUS | Status: DC | PRN

## 2020-12-06 MED ORDER — LACTATED RINGERS IV SOLN: INTRAVENOUS | Status: DC | PRN

## 2020-12-06 MED ORDER — KETOROLAC TROMETHAMINE 30 MG/ML IJ SOLN: 30.0000 mg | Freq: Four times a day (QID) | INTRAMUSCULAR | Status: AC | PRN

## 2020-12-06 MED ORDER — LACTATED RINGERS IV BOLUS: 1000.0000 mL | Freq: Once | INTRAVENOUS | Status: DC

## 2020-12-06 MED ORDER — FENTANYL CITRATE (PF) 100 MCG/2ML IJ SOLN
INTRAMUSCULAR | Status: AC
  Filled 2020-12-06: qty 2

## 2020-12-06 MED ORDER — DEXAMETHASONE SODIUM PHOSPHATE 10 MG/ML IJ SOLN
INTRAMUSCULAR | Status: DC | PRN
  Administered 2020-12-06: 10 mg via INTRAVENOUS

## 2020-12-06 MED ORDER — ONDANSETRON HCL 4 MG/2ML IJ SOLN: 4.0000 mg | Freq: Three times a day (TID) | INTRAMUSCULAR | Status: DC | PRN

## 2020-12-06 MED ORDER — BUPIVACAINE HCL (PF) 0.25 % IJ SOLN
INTRAMUSCULAR | Status: AC
  Filled 2020-12-06: qty 30

## 2020-12-06 MED ORDER — COCONUT OIL OIL: 1.0000 "application " | TOPICAL_OIL | Status: DC | PRN

## 2020-12-06 MED ORDER — BUPIVACAINE IN DEXTROSE 0.75-8.25 % IT SOLN
INTRATHECAL | Status: DC | PRN
  Administered 2020-12-06: 1.7 mL via INTRATHECAL

## 2020-12-06 MED ORDER — FAMOTIDINE IN NACL 20-0.9 MG/50ML-% IV SOLN
INTRAVENOUS | Status: AC
  Filled 2020-12-06: qty 50

## 2020-12-06 MED ORDER — MENTHOL 3 MG MT LOZG: 1.0000 | LOZENGE | OROMUCOSAL | Status: DC | PRN

## 2020-12-06 MED ORDER — MORPHINE SULFATE (PF) 0.5 MG/ML IJ SOLN
INTRAMUSCULAR | Status: AC
  Filled 2020-12-06: qty 10

## 2020-12-06 MED ORDER — SCOPOLAMINE 1 MG/3DAYS TD PT72
1.0000 | MEDICATED_PATCH | Freq: Once | TRANSDERMAL | Status: DC
  Administered 2020-12-06: 1.5 mg via TRANSDERMAL

## 2020-12-06 MED ORDER — PROMETHAZINE HCL 25 MG/ML IJ SOLN: 6.2500 mg | INTRAMUSCULAR | Status: DC | PRN

## 2020-12-06 MED ORDER — FAMOTIDINE IN NACL 20-0.9 MG/50ML-% IV SOLN
20.0000 mg | Freq: Once | INTRAVENOUS | Status: AC
  Administered 2020-12-06: 20 mg via INTRAVENOUS

## 2020-12-06 MED ORDER — SODIUM CHLORIDE 0.9% FLUSH: 3.0000 mL | INTRAVENOUS | Status: DC | PRN

## 2020-12-06 MED ORDER — PHENYLEPHRINE HCL-NACL 20-0.9 MG/250ML-% IV SOLN
INTRAVENOUS | Status: DC | PRN
  Administered 2020-12-06: 60 ug/min via INTRAVENOUS

## 2020-12-06 MED ORDER — KETOROLAC TROMETHAMINE 30 MG/ML IJ SOLN
30.0000 mg | Freq: Once | INTRAMUSCULAR | Status: AC | PRN
  Administered 2020-12-06: 30 mg via INTRAVENOUS

## 2020-12-06 MED ORDER — FENTANYL CITRATE (PF) 100 MCG/2ML IJ SOLN: INTRAMUSCULAR | Status: DC | PRN

## 2020-12-06 MED ORDER — OXYCODONE HCL 5 MG/5ML PO SOLN: 5.0000 mg | Freq: Once | ORAL | Status: DC | PRN

## 2020-12-06 MED ORDER — PRENATAL MULTIVITAMIN CH
1.0000 | ORAL_TABLET | Freq: Every day | ORAL | Status: DC
  Administered 2020-12-06 – 2020-12-08 (×3): 1 via ORAL
  Filled 2020-12-06 (×3): qty 1

## 2020-12-06 MED ORDER — NALOXONE HCL 0.4 MG/ML IJ SOLN: 0.4000 mg | INTRAMUSCULAR | Status: DC | PRN

## 2020-12-06 MED ORDER — NALBUPHINE HCL 10 MG/ML IJ SOLN: 5.0000 mg | INTRAMUSCULAR | Status: DC | PRN

## 2020-12-06 MED ORDER — LACTATED RINGERS IV SOLN: INTRAVENOUS | Status: DC

## 2020-12-06 MED ORDER — OXYCODONE HCL 5 MG PO TABS: 5.0000 mg | ORAL_TABLET | ORAL | Status: DC | PRN

## 2020-12-06 MED ORDER — GLYCOPYRROLATE 0.2 MG/ML IJ SOLN
INTRAMUSCULAR | Status: DC | PRN
  Administered 2020-12-06: .2 mg via INTRAVENOUS

## 2020-12-06 MED ORDER — PHENYLEPHRINE HCL-NACL 20-0.9 MG/250ML-% IV SOLN
INTRAVENOUS | Status: AC
  Filled 2020-12-06: qty 250

## 2020-12-06 MED ORDER — SOD CITRATE-CITRIC ACID 500-334 MG/5ML PO SOLN
30.0000 mL | Freq: Once | ORAL | Status: AC
  Administered 2020-12-06: 30 mL via ORAL
  Filled 2020-12-06: qty 15

## 2020-12-06 MED ORDER — ONDANSETRON HCL 4 MG/2ML IJ SOLN
INTRAMUSCULAR | Status: DC | PRN
  Administered 2020-12-06: 4 mg via INTRAVENOUS

## 2020-12-06 MED ORDER — METHYLERGONOVINE MALEATE 0.2 MG/ML IJ SOLN
0.2000 mg | INTRAMUSCULAR | Status: DC | PRN
Start: 2020-12-06 — End: 2020-12-08

## 2020-12-06 MED ORDER — CLINDAMYCIN PHOSPHATE 900 MG/50ML IV SOLN
900.0000 mg | INTRAVENOUS | Status: AC
  Administered 2020-12-06: 900 mg via INTRAVENOUS

## 2020-12-06 MED ORDER — SIMETHICONE 80 MG PO CHEW: 80.0000 mg | CHEWABLE_TABLET | ORAL | Status: DC | PRN

## 2020-12-06 MED ORDER — FENTANYL CITRATE (PF) 100 MCG/2ML IJ SOLN
50.0000 ug | Freq: Once | INTRAMUSCULAR | Status: AC
Start: 2020-12-06 — End: 2020-12-06
  Administered 2020-12-06: 50 ug via INTRAVENOUS
  Filled 2020-12-06: qty 2

## 2020-12-06 MED ORDER — SODIUM CHLORIDE 0.9 % IR SOLN
Status: DC | PRN
  Administered 2020-12-06: 400 mL

## 2020-12-06 MED ORDER — OXYTOCIN-SODIUM CHLORIDE 30-0.9 UT/500ML-% IV SOLN
INTRAVENOUS | Status: AC
  Filled 2020-12-06: qty 500

## 2020-12-06 MED ORDER — GENTAMICIN SULFATE 40 MG/ML IJ SOLN
5.0000 mg/kg | INTRAVENOUS | Status: DC
  Filled 2020-12-06: qty 10.5

## 2020-12-06 MED ORDER — SODIUM CHLORIDE 0.9 % IV SOLN: 250.0000 mL | INTRAVENOUS | Status: DC

## 2020-12-06 MED ORDER — GENTAMICIN SULFATE 40 MG/ML IJ SOLN
5.0000 mg/kg | INTRAVENOUS | Status: AC
  Administered 2020-12-06: 417.6 mg via INTRAVENOUS
  Filled 2020-12-06: qty 10.5

## 2020-12-06 MED ORDER — SENNOSIDES-DOCUSATE SODIUM 8.6-50 MG PO TABS
2.0000 | ORAL_TABLET | Freq: Every day | ORAL | Status: DC
  Administered 2020-12-07 – 2020-12-08 (×2): 2 via ORAL
  Filled 2020-12-06 (×2): qty 2

## 2020-12-06 MED ORDER — CLINDAMYCIN PHOSPHATE 900 MG/50ML IV SOLN
900.0000 mg | INTRAVENOUS | Status: DC
  Filled 2020-12-06: qty 50

## 2020-12-06 MED ORDER — FERROUS SULFATE 325 (65 FE) MG PO TABS
325.0000 mg | ORAL_TABLET | Freq: Two times a day (BID) | ORAL | Status: DC
  Administered 2020-12-06 – 2020-12-08 (×4): 325 mg via ORAL
  Filled 2020-12-06 (×4): qty 1

## 2020-12-06 MED ORDER — ACETAMINOPHEN 500 MG PO TABS
1000.0000 mg | ORAL_TABLET | Freq: Four times a day (QID) | ORAL | Status: AC
  Administered 2020-12-06 – 2020-12-07 (×3): 1000 mg via ORAL
  Filled 2020-12-06 (×4): qty 2

## 2020-12-06 MED ORDER — SODIUM CHLORIDE 0.9% FLUSH
3.0000 mL | Freq: Two times a day (BID) | INTRAVENOUS | Status: DC
  Administered 2020-12-07: 3 mL via INTRAVENOUS

## 2020-12-06 MED ORDER — OXYTOCIN-SODIUM CHLORIDE 30-0.9 UT/500ML-% IV SOLN
INTRAVENOUS | Status: DC | PRN
  Administered 2020-12-06: 30 [IU] via INTRAVENOUS

## 2020-12-06 MED ORDER — WITCH HAZEL-GLYCERIN EX PADS: 1.0000 "application " | MEDICATED_PAD | CUTANEOUS | Status: DC | PRN

## 2020-12-06 MED ORDER — IBUPROFEN 800 MG PO TABS
800.0000 mg | ORAL_TABLET | Freq: Three times a day (TID) | ORAL | Status: DC
  Administered 2020-12-06 – 2020-12-08 (×5): 800 mg via ORAL
  Filled 2020-12-06 (×5): qty 1

## 2020-12-06 MED ORDER — CLINDAMYCIN PHOSPHATE 900 MG/50ML IV SOLN
INTRAVENOUS | Status: AC
  Filled 2020-12-06: qty 50

## 2020-12-06 MED ORDER — NALBUPHINE HCL 10 MG/ML IJ SOLN: 5.0000 mg | Freq: Once | INTRAMUSCULAR | Status: DC | PRN

## 2020-12-06 MED ORDER — SCOPOLAMINE 1 MG/3DAYS TD PT72
MEDICATED_PATCH | TRANSDERMAL | Status: AC
  Filled 2020-12-06: qty 1

## 2020-12-06 MED ORDER — SIMETHICONE 80 MG PO CHEW
80.0000 mg | CHEWABLE_TABLET | Freq: Three times a day (TID) | ORAL | Status: DC
  Administered 2020-12-06 – 2020-12-08 (×5): 80 mg via ORAL
  Filled 2020-12-06 (×6): qty 1

## 2020-12-06 MED ORDER — METHYLERGONOVINE MALEATE 0.2 MG PO TABS
0.2000 mg | ORAL_TABLET | ORAL | Status: DC | PRN
Start: 2020-12-06 — End: 2020-12-08

## 2020-12-06 MED ORDER — BUPIVACAINE HCL (PF) 0.25 % IJ SOLN
INTRAMUSCULAR | Status: AC
  Filled 2020-12-06: qty 10

## 2020-12-06 MED ORDER — BISACODYL 10 MG RE SUPP: 10.0000 mg | Freq: Every day | RECTAL | Status: DC | PRN

## 2020-12-06 MED ORDER — KETOROLAC TROMETHAMINE 30 MG/ML IJ SOLN
INTRAMUSCULAR | Status: AC
  Filled 2020-12-06: qty 1

## 2020-12-06 MED ORDER — HYDROMORPHONE HCL 1 MG/ML IJ SOLN: 0.2500 mg | INTRAMUSCULAR | Status: DC | PRN

## 2020-12-06 MED ORDER — OXYTOCIN-SODIUM CHLORIDE 30-0.9 UT/500ML-% IV SOLN: 2.5000 [IU]/h | INTRAVENOUS | Status: AC

## 2020-12-06 MED ORDER — FLEET ENEMA 7-19 GM/118ML RE ENEM: 1.0000 | ENEMA | Freq: Every day | RECTAL | Status: DC | PRN

## 2020-12-06 MED ORDER — DIPHENHYDRAMINE HCL 50 MG/ML IJ SOLN: 12.5000 mg | INTRAMUSCULAR | Status: DC | PRN

## 2020-12-06 MED ORDER — POVIDONE-IODINE 10 % EX SWAB
2.0000 "application " | Freq: Once | CUTANEOUS | Status: AC
  Administered 2020-12-06: 2 via TOPICAL

## 2020-12-06 MED ORDER — SODIUM CHLORIDE 0.9 % IR SOLN
Status: DC | PRN
  Administered 2020-12-06: 1000 mL

## 2020-12-06 MED ORDER — NALOXONE HCL 4 MG/10ML IJ SOLN
1.0000 ug/kg/h | INTRAVENOUS | Status: DC | PRN
  Filled 2020-12-06: qty 5

## 2020-12-06 MED ORDER — ZOLPIDEM TARTRATE 5 MG PO TABS: 5.0000 mg | ORAL_TABLET | Freq: Every evening | ORAL | Status: DC | PRN

## 2020-12-06 MED ORDER — DIPHENHYDRAMINE HCL 25 MG PO CAPS: 25.0000 mg | ORAL_CAPSULE | Freq: Four times a day (QID) | ORAL | Status: DC | PRN

## 2020-12-06 MED ORDER — GLYCOPYRROLATE PF 0.2 MG/ML IJ SOSY
PREFILLED_SYRINGE | INTRAMUSCULAR | Status: AC
  Filled 2020-12-06: qty 1

## 2020-12-06 MED ORDER — OXYCODONE HCL 5 MG PO TABS: 5.0000 mg | ORAL_TABLET | Freq: Once | ORAL | Status: DC | PRN

## 2020-12-06 MED ORDER — DIPHENHYDRAMINE HCL 25 MG PO CAPS: 25.0000 mg | ORAL_CAPSULE | ORAL | Status: DC | PRN

## 2020-12-06 MED ORDER — CLINDAMYCIN PHOSPHATE 900 MG/50ML IV SOLN: INTRAVENOUS | Status: DC | PRN

## 2020-12-06 MED ORDER — FENTANYL CITRATE (PF) 100 MCG/2ML IJ SOLN
INTRAMUSCULAR | Status: DC | PRN
  Administered 2020-12-06: 15 ug via INTRATHECAL

## 2020-12-06 SURGICAL SUPPLY — 42 items
BARRIER ADHS 3X4 INTERCEED (GAUZE/BANDAGES/DRESSINGS) ×4 IMPLANT
BENZOIN TINCTURE PRP APPL 2/3 (GAUZE/BANDAGES/DRESSINGS) ×2 IMPLANT
CHLORAPREP W/TINT 26ML (MISCELLANEOUS) ×2 IMPLANT
CLAMP CORD UMBIL (MISCELLANEOUS) IMPLANT
CLOTH BEACON ORANGE TIMEOUT ST (SAFETY) ×2 IMPLANT
CLSR STERI-STRIP ANTIMIC 1/2X4 (GAUZE/BANDAGES/DRESSINGS) ×2 IMPLANT
DRAPE C SECTION CLR SCREEN (DRAPES) ×2 IMPLANT
DRSG OPSITE POSTOP 4X10 (GAUZE/BANDAGES/DRESSINGS) ×2 IMPLANT
ELECT REM PT RETURN 9FT ADLT (ELECTROSURGICAL) ×2
ELECTRODE REM PT RTRN 9FT ADLT (ELECTROSURGICAL) ×1 IMPLANT
EXTRACTOR VACUUM M CUP 4 TUBE (SUCTIONS) IMPLANT
GLOVE BIOGEL PI IND STRL 7.0 (GLOVE) ×2 IMPLANT
GLOVE BIOGEL PI INDICATOR 7.0 (GLOVE) ×2
GLOVE ECLIPSE 6.5 STRL STRAW (GLOVE) ×2 IMPLANT
GOWN STRL REUS W/TWL LRG LVL3 (GOWN DISPOSABLE) ×4 IMPLANT
KIT ABG SYR 3ML LUER SLIP (SYRINGE) IMPLANT
NEEDLE HYPO 22GX1.5 SAFETY (NEEDLE) ×2 IMPLANT
NEEDLE HYPO 25X5/8 SAFETYGLIDE (NEEDLE) IMPLANT
NS IRRIG 1000ML POUR BTL (IV SOLUTION) ×2 IMPLANT
PACK C SECTION WH (CUSTOM PROCEDURE TRAY) ×2 IMPLANT
PAD OB MATERNITY 4.3X12.25 (PERSONAL CARE ITEMS) ×2 IMPLANT
RTRCTR C-SECT PINK 25CM LRG (MISCELLANEOUS) IMPLANT
STRIP CLOSURE SKIN 1/2X4 (GAUZE/BANDAGES/DRESSINGS) IMPLANT
SUT CHROMIC GUT AB #0 18 (SUTURE) IMPLANT
SUT MNCRL 0 VIOLET CTX 36 (SUTURE) ×3 IMPLANT
SUT MON AB 2-0 SH 27 (SUTURE)
SUT MON AB 2-0 SH27 (SUTURE) IMPLANT
SUT MON AB 3-0 SH 27 (SUTURE)
SUT MON AB 3-0 SH27 (SUTURE) IMPLANT
SUT MON AB 4-0 PS1 27 (SUTURE) IMPLANT
SUT MONOCRYL 0 CTX 36 (SUTURE) ×3
SUT PLAIN 2 0 (SUTURE) ×1
SUT PLAIN 2 0 XLH (SUTURE) IMPLANT
SUT PLAIN ABS 2-0 CT1 27XMFL (SUTURE) ×1 IMPLANT
SUT VIC AB 0 CT1 36 (SUTURE) ×6 IMPLANT
SUT VIC AB 2-0 CT1 27 (SUTURE) ×1
SUT VIC AB 2-0 CT1 TAPERPNT 27 (SUTURE) ×1 IMPLANT
SUT VIC AB 4-0 PS2 27 (SUTURE) ×2 IMPLANT
SYR CONTROL 10ML LL (SYRINGE) ×2 IMPLANT
TOWEL OR 17X24 6PK STRL BLUE (TOWEL DISPOSABLE) ×2 IMPLANT
TRAY FOLEY W/BAG SLVR 14FR LF (SET/KITS/TRAYS/PACK) IMPLANT
WATER STERILE IRR 1000ML POUR (IV SOLUTION) ×2 IMPLANT

## 2020-12-06 NOTE — MAU Provider Note (Signed)
History     Chief Complaint  Patient presents with  . Contractions   40 yo G3P2002 MWF s/p Previous C/S x 2 @ 38 6/[redacted] wk gestation   Present in active labor. Pt is scheduled  For repeat C/S 4/15. Desires to proceed with repeat c/s  OB History    Gravida  3   Para  3   Term  3   Preterm  0   AB  0   Living  3     SAB  0   IAB  0   Ectopic  0   Multiple  0   Live Births  3           Past Medical History:  Diagnosis Date  . Gestational diabetes   . Vaginal Pap smear, abnormal     Past Surgical History:  Procedure Laterality Date  . CERVICAL BIOPSY  W/ LOOP ELECTRODE EXCISION     2001  . CESAREAN SECTION  07/26/2012   Procedure: CESAREAN SECTION;  Surgeon: Serita Kyle, MD;  Location: [facility];  Service: Obstetrics;  Laterality: N/A;  Primary Cesarean Section Delivery Baby Girl @  [phone], Apgars 8/9   . CESAREAN SECTION N/A 05/24/2017   Procedure: CESAREAN SECTION;  Surgeon: Maxie Better, MD;  [facility];  Service: Obstetrics;  Laterality: N/A;  . WISDOM TOOTH EXTRACTION      Family History  Problem Relation Age of Onset  . Arthritis Mother   . Skin cancer Mother   . COPD Mother   . Uterine cancer Mother   . COPD Father   . Peripheral vascular disease Father   . Deep vein thrombosis Father   . Hypertension Father   . Hepatitis C Father   . Lung cancer Father   . Diabetes Father   . Colon cancer Maternal Grandmother     Social History   Tobacco Use  . Smoking status: Never Smoker  . Smokeless tobacco: Never Used  Vaping Use  . Vaping Use: Never used  Substance Use Topics  . Alcohol use: Not Currently  . Drug use: Never    Allergies:  Allergies  Allergen Reactions  . Ceclor [Cefaclor] Rash  . Latex Rash  . Sulfa Antibiotics Rash    Childhood reaction    Medications Prior to Admission  Medication Sig Dispense Refill Last Dose  . calcium carbonate (TUMS - DOSED IN MG ELEMENTAL CALCIUM) 500 MG chewable  tablet Chew 1-2 tablets by mouth 3 (three) times daily as needed for indigestion or heartburn.   12/05/2020 at Unknown time  . Prenatal Vit-Fe Fumarate-FA (PRENATAL VITAMIN PO) Take 1 tablet by mouth in the morning.   12/05/2020 at Unknown time     Physical Exam   Height 5\' 5"  (1.651 m), weight 83.5 kg, unknown if currently breastfeeding.  General appearance: alert, cooperative and moderate distress Lungs: clear to auscultation bilaterally Heart: regular rate and rhythm, S1, S2 normal, no murmur, click, rub or gallop Abdomen: gravid palp ctx Skin: Skin color, texture, turgor normal. No rashes or lesions  Pelvic per RN:  7-8 cm per RN ED Course  Active Labor Term gestation Previous C/S x 2 P) Repeat C/S. OR notified. Surgical risk previously discussed in office.infection bleeding, injury to surrounding organ structures, poss need for blood transfusion and its irsk. All ? Answered. Consent signed MDM   , MD 6:56 AM 12/06/2020

## 2020-12-06 NOTE — H&P (Addendum)
[name] [name] is a 40 y.o. female presenting @ term gestation in active labor. Intact membrane.  OB History    Gravida  3   Para  2   Term  2   Preterm  0   AB  0   Living  2     SAB  0   IAB  0   Ectopic  0   Multiple  0   Live Births  2          Past Medical History:  Diagnosis Date  . Gestational diabetes   . Vaginal Pap smear, abnormal    Past Surgical History:  Procedure Laterality Date  . CERVICAL BIOPSY  W/ LOOP ELECTRODE EXCISION     2001  . CESAREAN SECTION  07/26/2012   Procedure: CESAREAN SECTION;  Surgeon: Serita Kyle, MD;  Location: [facility];  Service: Obstetrics;  Laterality: N/A;  Primary Cesarean Section Delivery Baby Girl @  [phone], Apgars 8/9   . CESAREAN SECTION N/A 05/24/2017   Procedure: CESAREAN SECTION;  Surgeon: Maxie Better, MD;  Location: [facility] BIRTHING SUIT[facility];  Service: Obstetrics;  Laterality: N/A;  . WISDOM TOOTH EXTRACTION     Family History: family history includes Arthritis in her mother; COPD in her father and mother; Colon cancer in her maternal grandmother; Deep vein thrombosis in her father; Diabetes in her father; Hepatitis C in her father; Hypertension in her father; Lung cancer in her father; Peripheral vascular disease in her father; Skin cancer in her mother; Uterine cancer in her mother. Social History:  reports that she has never smoked. She has never used smokeless tobacco. She reports previous alcohol use. She reports that she does not use drugs.     Maternal Diabetes: Yes:  Diabetes Type:  Diet controlled Genetic Screening: Normal Maternal Ultrasounds/Referrals: Normal Fetal Ultrasounds or other Referrals:  None Maternal Substance Abuse:  No Significant Maternal Medications:  None Significant Maternal Lab Results:  Group B Strep negative Other Comments:  Class A1 GDM  Review of Systems  All other systems reviewed and are negative.  History   currently breastfeeding. Maternal Exam:  Introitus: Normal  vulva.   Physical Exam Constitutional:      Appearance: Normal appearance.  Eyes:     Extraocular Movements: Extraocular movements intact.  Cardiovascular:     Rate and Rhythm: Regular rhythm.  Pulmonary:     Breath sounds: Normal breath sounds.  Genitourinary:    General: Normal vulva.     Comments: Cervix 7-8 cm  Musculoskeletal:        General: No swelling.     Cervical back: Neck supple.  Skin:    General: Skin is warm and dry.  Neurological:     Mental Status: She is alert and oriented to person, place, and time.  Psychiatric:        Mood and Affect: Mood normal.        Behavior: Behavior normal.     Prenatal labs: ABO, Rh: --/--/B POS (04/13 7619) Antibody: NEG (04/13 0934) Rubella:  Immune RPR:   NR HBsAg:   neg HIV:   neg GBS:   neg  Assessment/Plan: Previous C/S x 2 Active Labor Term gestation Class A1 GDM  P) repeat C/S. Risk of surgery including infection, bleeding, injury to bladder, bowel, ureter, internal  Scar tissue, poss need for blood transfusion. All ? answered   [name] [name] A [name] [name] 12/06/2020, 5:08 AM

## 2020-12-06 NOTE — MAU Note (Signed)
Patient reports to [facility] complaining of contractions 1-2 minutes apart. Denies vaginal bleeding or leaking of fluid. +FM

## 2020-12-06 NOTE — Brief Op Note (Signed)
12/06/2020  7:06 AM  PATIENT:  Rayburn Go  40 y.o. female  PRE-OPERATIVE DIAGNOSIS:  Active Labor, term gestation, previous C/S x 2, Class A1GDM  POST-OPERATIVE DIAGNOSIS:  same  PROCEDURE:  Repeat Cesarean section, kerr hysterectomy  SURGEON:  Surgeon(s) and Role:    * Maxie Better, MD - Primary  PHYSICIAN ASSISTANT:   ASSISTANTS: none   ANESTHESIA:   spinal FINDING:  Live female ROP. Nl tubes and ovaries EBL:    BLOOD ADMINISTERED:none  DRAINS: none   LOCAL MEDICATIONS USED:  MARCAINE     SPECIMEN:  No Specimen  DISPOSITION OF SPECIMEN:  N/A  COUNTS:  YES  TOURNIQUET:  * No tourniquets in log *  DICTATION: .Other Dictation: Dictation Number [id number]  PLAN OF CARE: Admit to inpatient   PATIENT DISPOSITION:  PACU - hemodynamically stable.   Delay start of Pharmacological VTE agent (>24hrs) due to surgical blood loss or risk of bleeding: no

## 2020-12-06 NOTE — Anesthesia Postprocedure Evaluation (Signed)
Anesthesia Post Note  Patient: [name] [name]  Procedure(s) Performed: CESAREAN SECTION (N/A )     Patient location during evaluation: PACU Anesthesia Type: Spinal Level of consciousness: awake and alert and oriented Pain management: pain level controlled Vital Signs Assessment: post-procedure vital signs reviewed and stable Respiratory status: spontaneous breathing, nonlabored ventilation and respiratory function stable Cardiovascular status: blood pressure returned to baseline and stable Postop Assessment: no headache, no backache, spinal receding and patient able to bend at knees Anesthetic complications: no   No complications documented.  Last Vitals:  Vitals:   12/06/20 1425 12/06/20 1554  BP:    Pulse:    Resp: 18 18  Temp:  36.9 C  SpO2: 97% 97%    Last Pain:  Vitals:   12/06/20 1554  TempSrc: Oral  PainSc: 0-No pain   Pain Goal:                   [name]

## 2020-12-06 NOTE — Anesthesia Procedure Notes (Signed)
Spinal  Patient location during procedure: OR Start time: 12/06/2020 5:38 AM End time: 12/06/2020 5:40 AM Reason for block: surgical anesthesia Staffing Performed: anesthesiologist  Anesthesiologist: [name], DO Preanesthetic Checklist Completed: patient identified, IV checked, risks and benefits discussed, surgical consent, monitors and equipment checked, pre-op evaluation and timeout performed Spinal Block Patient position: sitting Prep: DuraPrep and site prepped and draped Patient monitoring: cardiac monitor, continuous pulse ox and blood pressure Approach: midline Location: L3-4 Injection technique: single-shot Needle Needle type: Pencan  Needle gauge: 24 G Needle length: 9 cm Assessment Sensory level: T6 Events: CSF return Additional Notes Functioning IV was confirmed and monitors were applied. Sterile prep and drape, including hand hygiene and sterile gloves were used. The patient was positioned and the spine was prepped. The skin was anesthetized with lidocaine.  Free flow of clear CSF was obtained prior to injecting local anesthetic into the CSF.  The spinal needle aspirated freely following injection.  The needle was carefully withdrawn.  The patient tolerated the procedure well.

## 2020-12-06 NOTE — Progress Notes (Signed)
To OR via stretcher

## 2020-12-06 NOTE — Anesthesia Preprocedure Evaluation (Addendum)
Anesthesia Evaluation  Patient identified by MRN, date of birth, ID band Patient awake    Reviewed: Allergy & Precautions, NPO status , Patient's Chart, lab work & pertinent test results  Airway Mallampati: II  TM Distance: >3 FB Neck ROM: Full    Dental no notable dental hx.    Pulmonary neg pulmonary ROS,    Pulmonary exam normal breath sounds clear to auscultation       Cardiovascular negative cardio ROS Normal cardiovascular exam Rhythm:Regular Rate:Normal     Neuro/Psych negative neurological ROS  negative psych ROS   GI/Hepatic negative GI ROS, Neg liver ROS,   Endo/Other  diabetes, Well Controlled, Gestational  Renal/GU negative Renal ROS  negative genitourinary   Musculoskeletal negative musculoskeletal ROS (+)   Abdominal   Peds negative pediatric ROS (+)  Hematology negative hematology ROS (+) hct 38, plt 181   Anesthesia Other Findings   Reproductive/Obstetrics (+) Pregnancy 2 prior c sections, presented to MAU contracting and dilated to 8cm                            Anesthesia Physical Anesthesia Plan  ASA: III and emergent  Anesthesia Plan: Spinal   Post-op Pain Management:    Induction:   PONV Risk Score and Plan: 3 and Ondansetron, Dexamethasone and Treatment may vary due to age or medical condition  Airway Management Planned: Natural Airway and Nasal Cannula  Additional Equipment: None  Intra-op Plan:   Post-operative Plan:   Informed Consent: I have reviewed the patients History and Physical, chart, labs and discussed the procedure including the risks, benefits and alternatives for the proposed anesthesia with the patient or authorized representative who has indicated his/her understanding and acceptance.       Plan Discussed with: CRNA  Anesthesia Plan Comments: (Communicated w/ MAU that we need stat CBC, for some reason was not drawn with her preop  labs yesterday (scheduled for section 4/15))       Anesthesia Quick Evaluation

## 2020-12-06 NOTE — Lactation Note (Signed)
This note was copied from a baby's chart. Lactation Consultation Note  Patient Name: [name] [name] [id number] Date: 12/06/2020   Age:40 hours  Parents and baby [name] [name] [name] 5 hours post csection.  [name] stirring a little in crib.  Mom reports she and dad are tired. Mom is an experienced breastfeeding mom. Breastfed her first two children for a little over 1 year. Mom reports had some challenges with her second baby with her left nipple.  Had to stop bf on it and let it heal and exclusively pump for a short time.   Urged mom to hand express and rub expressed mothers milk on nipples as preventative to help with soreness.  Urged to feed on cue/demand 8-12 or more times day.  Urged mom to call lactation as needed.  Maternal Data    Feeding    LATCH Score Latch: Repeated attempts needed to sustain latch, nipple held in mouth throughout feeding, stimulation needed to elicit sucking reflex.  Audible Swallowing: A few with stimulation  Type of Nipple: Everted at rest and after stimulation  Comfort (Breast/Nipple): Soft / non-tender  Hold (Positioning): Assistance needed to correctly position infant at breast and maintain latch.  LATCH Score: 7   Lactation Tools Discussed/Used    Interventions    Discharge    Consult Status      [name] Michaelle Copas 12/06/2020, 12:00 PM

## 2020-12-06 NOTE — Transfer of Care (Signed)
Immediate Anesthesia Transfer of Care Note  Patient: [name] [name]  Procedure(s) Performed: CESAREAN SECTION (N/A )  Patient Location: PACU  Anesthesia Type:Spinal  Level of Consciousness: awake  Airway & Oxygen Therapy: Patient Spontanous Breathing  Post-op Assessment: Report given to RN and Post -op Vital signs reviewed and stable  Post vital signs: Reviewed and stable  Last Vitals:  Vitals Value Taken Time  BP 105/58 12/06/20 0704  Temp    Pulse 73 12/06/20 0707  Resp 16 12/06/20 0707  SpO2 97 % 12/06/20 0707  Vitals shown include unvalidated device data.  Last Pain: There were no vitals filed for this visit.       Complications: No complications documented.

## 2020-12-06 NOTE — Progress Notes (Signed)
FHR 130 on transfer to OR.

## 2020-12-07 ENCOUNTER — Inpatient Hospital Stay (HOSPITAL_COMMUNITY)
Admission: RE | Admit: 2020-12-07 | Payer: Federal, State, Local not specified - PPO | Source: Home / Self Care | Admitting: Obstetrics and Gynecology

## 2020-12-07 LAB — CBC
HCT: 29.6 % — ABNORMAL LOW (ref 36.0–46.0)
Hemoglobin: 9.9 g/dL — ABNORMAL LOW (ref 12.0–15.0)
MCH: 29.5 pg (ref 26.0–34.0)
MCHC: 33.4 g/dL (ref 30.0–36.0)
MCV: 88.1 fL (ref 80.0–100.0)
Platelets: 162 10*3/uL (ref 150–400)
RBC: 3.36 MIL/uL — ABNORMAL LOW (ref 3.87–5.11)
RDW: 13.6 % (ref 11.5–15.5)
WBC: 16.3 10*3/uL — ABNORMAL HIGH (ref 4.0–10.5)
nRBC: 0 % (ref 0.0–0.2)

## 2020-12-07 LAB — BIRTH TISSUE RECOVERY COLLECTION (PLACENTA DONATION)

## 2020-12-07 MED ORDER — ACETAMINOPHEN 500 MG PO TABS
1000.0000 mg | ORAL_TABLET | Freq: Four times a day (QID) | ORAL | Status: DC | PRN
  Administered 2020-12-07 (×2): 1000 mg via ORAL
  Filled 2020-12-07 (×3): qty 2

## 2020-12-07 NOTE — Op Note (Signed)
NAME: [name] [name], HOPFER MEDICAL RECORD NO: [id number] ACCOUNT NO: [id number] DATE OF BIRTH: Sep 23, 1980 FACILITY: [facility] LOCATION: [facility] PHYSICIAN: Srihaan Mastrangelo A. Cherly Hensen, MD  Operative Report   DATE OF PROCEDURE: 12/06/2020  PREOPERATIVE DIAGNOSES:  Active labor, previous cesarean section x2, intrauterine gestation at 31 and 6/7th weeks.  POSTOPERATIVE DIAGNOSES:  Active labor, previous cesarean section x2, intrauterine gestation at 60 and 6/7th weeks.  PROCEDURE:  A repeat cesarean section, Kerr hysterotomy.  ANESTHESIA:  Spinal.  SURGEON:  Alleene Stoy A. Cherly Hensen, MD.  ASSISTANT:  None.   DESCRIPTION OF PROCEDURE:  Under adequate spinal anesthesia, the patient was placed in the supine position with a left lateral tilt.  She was sterilely prepped and draped in the usual fashion.  An indwelling Foley catheter was sterilely placed.  0.25%  Marcaine was injected along the previous Pfannenstiel skin incision site.  Pfannenstiel skin incision was made through the previous scar, carried down to the rectus fascia.  Rectus fascia was opened transversely.  The rectus fascia was sharply and  bluntly dissected off the rectus muscle in a superior and inferior fashion.  On doing so, there was diastasis noted and the bladder was noted to be in the field.  Careful superior dissection was then performed with opening of the parietal peritoneum bluntly in a superior fashion and displacing the bladder with the Foley bulb inferiorly.  Once the peritoneum was carefully opened in a superior and inferior fashion, the Alexis self-retaining retractor was then placed.  The bladder was inspected, was  high on the uterus.  The vesicouterine peritoneum was sharply opened transversely and careful sharp dissection was then performed to displace the bladder inferiorly along with the Foley bulb.  Once this was done, a curvilinear incision was made through  the previous scar that was noted on the uterus in the lower uterine  segment and artificial rupture of membranes at that point occurred.  Clear amniotic fluid noted and blunt extension of the incision in a superior and inferior fashion was then occurred.   Subsequently, it was noted that there was a live female from the right occiput posterior position, who was delivered, bulb suctioned on the abdomen.  Delayed cord clamp x1 minute.  Cord was then clamped and cut.  The baby was transferred to the waiting  pediatricians who assigned the Apgars.  Apgars of 8 and 9 at one and five minutes respectively.  The placenta was spontaneous intact, not sent to pathology.  Uterine cavity was cleaned of debris.  Uterine incision bilaterally extended and the incision was  closed with 0 Monocryl running locked stitch first layer, second layer was imbricated in the midportion using 0 Monocryl suture as well.  Good hemostasis was achieved.  The bladder was further sharply dissected off the lower uterine segment prior to closing the uterine incision.  Normal tubes and ovaries were noted bilaterally.  The abdomen was then irrigated and suctioned of debris.  Interceed was placed in an inverted T fashion overlying the incision.  The Alexis retractor was then removed.  The parietal peritoneum was then carefully closed with 2-0 Vicryl.  The rectus fascia was closed with 0 Vicryl x 2.  The subcutaneous area was irrigated, small bleeders cauterized.  Interrupted 2-0 plain sutures were placed and the skin approximated using 4-0 Vicryl  subcuticular closure.  Steri-Strips and benzoin was placed.  Honeycomb dressing was placed.  SPECIMENS:  Placenta, not sent to Pathology.    ESTIMATED BLOOD LOSS:  605 mL  INTRAOPERATIVE FLUIDS:  2500  mL  URINE OUTPUT:  150 mL clear yellow urine.    Sponge and instrument counts x2 was correct.    Complication was none.    The patient tolerated the procedure well and was transferred to recovery room in stable condition.      PAA D: 12/06/2020 7:19:08 am T:  12/07/2020 4:11:00 am  JOB: [id number]/ [id number]

## 2020-12-07 NOTE — Progress Notes (Signed)
SVD: repeat  S:  Pt reports feeling  Well except c/o blurry vision that started today. Pt did not think she touched the scopolamine patch/ Tolerating po/ Voiding without problems/ No n/v/ Bleeding is light/ Pain controlled withprescription NSAID's including motrin    O:  A & O x 3 / VS: Blood pressure 105/64, pulse (!) 53, temperature 98 F (36.7 C), temperature source Oral, resp. rate 18, height 5\' 5"  (1.651 m), weight 83.5 kg, SpO2 98 %, unknown if currently breastfeeding.  LABS:  Results for orders placed or performed during the hospital encounter of 12/06/20 (from the past 24 hour(s))  CBC     Status: Abnormal   Collection Time: 12/07/20  5:09 AM  Result Value Ref Range   WBC 16.3 (H) 4.0 - 10.5 K/uL   RBC 3.36 (L) 3.87 - 5.11 MIL/uL   Hemoglobin 9.9 (L) 12.0 - 15.0 g/dL   HCT 12/09/20 (L) 70.2 - 63.7 %   MCV 88.1 80.0 - 100.0 fL   MCH 29.5 26.0 - 34.0 pg   MCHC 33.4 30.0 - 36.0 g/dL   RDW 85.8 85.0 - 27.7 %   Platelets 162 150 - 400 K/uL   nRBC 0.0 0.0 - 0.2 %  Collect bld for placenta donatation     Status: None   Collection Time: 12/07/20  5:09 AM  Result Value Ref Range   Placenta donation bld collect Collected by Laboratory     I&O: I/O last 3 completed shifts: In: 5712.9 [P.O.:1440; I.V.:4113.1; IV Piggyback:159.8] Out: 5821 [Urine:5075; Blood:746]   No intake/output data recorded.  Lungs: chest clear, no wheezing, rales, normal symmetric air entry  Heart: regular rate and rhythm, S1, S2 normal, no murmur, click, rub or gallop  Abdomen: benign non-tender, without masses or organomegaly palpable  Perineum: not inspected  Lochia: light  Extremities:no redness or tenderness in the calves or thighs, no edema  Incision. Honeycomb dressing  1/3 stained brown. Removed. Wet left sided steristrip noted and removed. Light blood fluid noted. All steristrip removed. No erythema or induration noted. Wound vac applied  A/P: POD # 1/PPD # 1/ 12/09/20  Visual changes prob related to  scopolamine patch  Doing well  Continue routine post partum orders  Anticipate d/c in am

## 2020-12-07 NOTE — Lactation Note (Addendum)
This note was copied from a baby's chart. Lactation Consultation Note  Patient Name: [name] [name] [id number] Date: 12/07/2020 Reason for consult: Follow-up assessment;Early term 37-38.6wks Age:40 hours P3, ETI female infant with -5% weight loss. Infant had 7 voids and 3 stools since delivery. Mom decline donor breast milk at this time,  RN mention infant's lips appeared dry. Mom prefers to hand express only  and give infant back her EBM after latching infant at the breast,  than use any type other type of  supplement. Per mom, sometimes she feels pinched with infant's  latch. LC assisted mom in latching infant at the breast, mom latched infant on her left breast using the football hold position, infant latched with depth swallows heard by mom and LC " cuh" sound. Per mom, she only feels tug no pain with latch. Mom knows to break latch and re-latch infant at the breast if she feels pain or pinching. Mom is experienced with breastfeeding she BF her other children for one year each.  Mom knows to call RN or LC if she needs any further assistance with latching infant at the breast. LC discussed infant's input and output with mom.  Mom knows infant is currently cluster feeding and she will continue to latch infant according to cues, 8 to 12 or more times within 24 hours, STS.   Maternal Data    Feeding Mother's Current Feeding Choice: Breast Milk  LATCH Score Latch: Grasps breast easily, tongue down, lips flanged, rhythmical sucking.  Audible Swallowing: Spontaneous and intermittent  Type of Nipple: Everted at rest and after stimulation  Comfort (Breast/Nipple): Soft / non-tender  Hold (Positioning): Assistance needed to correctly position infant at breast and maintain latch.  LATCH Score: 9   Lactation Tools Discussed/Used    Interventions Interventions: Assisted with latch;Skin to skin;Hand express;Breast compression;Adjust position;Support pillows;Position options;Expressed  milk;Education  Discharge    Consult Status Consult Status: Follow-up Date: 12/08/20 Follow-up type: In-patient    Danelle Earthly 12/07/2020, 9:55 PM

## 2020-12-08 MED ORDER — IBUPROFEN 800 MG PO TABS
800.0000 mg | ORAL_TABLET | Freq: Four times a day (QID) | ORAL | 11 refills | Status: AC | PRN
Start: 2020-12-08 — End: ?

## 2020-12-08 MED ORDER — OXYCODONE HCL 5 MG PO TABS: 5.0000 mg | ORAL_TABLET | ORAL | 0 refills | Status: AC | PRN

## 2020-12-08 NOTE — Plan of Care (Signed)
  Problem: Health Behavior/Discharge Planning: Goal: Ability to manage health-related needs will improve Outcome: Completed/Met   Problem: Clinical Measurements: Goal: Ability to maintain clinical measurements within normal limits will improve Outcome: Completed/Met Goal: Will remain free from infection Outcome: Completed/Met Goal: Diagnostic test results will improve Outcome: Completed/Met Goal: Respiratory complications will improve Outcome: Completed/Met Goal: Cardiovascular complication will be avoided Outcome: Completed/Met   Problem: Coping: Goal: Level of anxiety will decrease Outcome: Completed/Met   Problem: Elimination: Goal: Will not experience complications related to urinary retention Outcome: Completed/Met   Problem: Pain Managment: Goal: General experience of comfort will improve Outcome: Completed/Met   Problem: Safety: Goal: Ability to remain free from injury will improve Outcome: Completed/Met   Problem: Skin Integrity: Goal: Risk for impaired skin integrity will decrease Outcome: Completed/Met   Problem: Education: Goal: Knowledge of condition will improve Outcome: Completed/Met   Problem: Coping: Goal: Ability to identify and utilize available resources and services will improve Outcome: Completed/Met   Problem: Life Cycle: Goal: Chance of risk for complications during the postpartum period will decrease Outcome: Completed/Met   Problem: Role Relationship: Goal: Ability to demonstrate positive interaction with newborn will improve Outcome: Completed/Met   Problem: Skin Integrity: Goal: Demonstration of wound healing without infection will improve Outcome: Completed/Met

## 2020-12-08 NOTE — Discharge Summary (Signed)
Postpartum Discharge Summary  Date of Service updated     Patient Name: [name] [name] DOB: 04/05/1981 MRN: [id number]  Date of admission: 12/06/2020 Delivery date:12/06/2020  Delivering provider: Lelaina Oatis  Date of discharge: 12/08/2020  Admitting diagnosis:  Active labor, Previous cesarean section complicating pregnancy [C58.527], IUP @ 103 6/7 wk, Class A1 GDM   Intrauterine pregnancy: [redacted]w[redacted]d    Secondary diagnosis:  Active Problems:   Previous cesarean section complicating pregnancy   Postpartum care following cesarean delivery  Additional problems: Class A1 GDM    Discharge diagnosis: Term Pregnancy Delivered, GDM A1, and Anemia                                              Post partum procedures: none Augmentation: N/A Complications: None  Hospital course: Sceduled C/S   40y.o. yo G3P3003 at [id number] admitted to the hospital 12/06/2020 for scheduled cesarean section with the following indication:Elective Repeat.Delivery details are as follows:  Membrane Rupture Time/Date: 6:07 AM ,12/06/2020   Delivery Method:C-Section, Low Transverse  Details of operation can be found in separate operative note.  Patient had an uncomplicated postpartum course.  She is ambulating, tolerating a regular diet, passing flatus, and urinating well. Patient is discharged home in stable condition on  12/08/20        Newborn Data: Birth date:12/06/2020  Birth time:6:08 AM  Gender:Female  Living status:Living  Apgars:8 ,9  Weight:3.49 kg     Magnesium Sulfate received: No BMZ received: No Rhophylac:N/A MMR:N/A T-DaP:Given prenatally Flu: No Transfusion:No  Physical exam  Vitals:   12/06/20 2345 12/07/20 0555 12/07/20 2025 12/08/20 0554  BP: (!) 93/52 105/64 (!) 112/58 106/67  Pulse: (!) 49 (!) 53 (!) 55 (!) 59  Resp: 20 18 18 18   Temp: 98 F (36.7 C) 98 F (36.7 C) 98 F (36.7 C) 98.6 F (37 C)  TempSrc: Oral Oral Oral Oral  SpO2: 100% 98%    Weight:      Height:        General: alert, cooperative, and no distress Lochia: appropriate Uterine Fundus: firm Incision: Dressing is clean, dry, and intact DVT Evaluation: No evidence of DVT seen on physical exam. Labs: Lab Results  Component Value Date   WBC 16.3 (H) 12/07/2020   HGB 9.9 (L) 12/07/2020   HCT 29.6 (L) 12/07/2020   MCV 88.1 12/07/2020   PLT 162 12/07/2020   CMP Latest Ref Rng & Units 04/22/2018  Glucose 65 - 139 mg/dL 89  BUN 7 - 25 mg/dL 13  Creatinine 0.50 - 1.10 mg/dL 0.72  Sodium 135 - 146 mmol/L 140  Potassium 3.5 - 5.3 mmol/L 4.1  Chloride 98 - 110 mmol/L 105  CO2 20 - 32 mmol/L 27  Calcium 8.6 - 10.2 mg/dL 9.1  Total Protein 6.1 - 8.1 g/dL 7.5  Total Bilirubin 0.2 - 1.2 mg/dL 0.3  Alkaline Phos 39 - 117 U/L -  AST 10 - 30 U/L 20  ALT 6 - 29 U/L 15   Edinburgh Score: Edinburgh Postnatal Depression Scale Screening Tool 12/07/2020  I have been able to laugh and see the funny side of things. 0  I have looked forward with enjoyment to things. 0  I have blamed myself unnecessarily when things went wrong. 1  I have been anxious or worried for no good reason. 2  I have felt scared or panicky for no good reason. 0  Things have been getting on top of me. 0  I have been so unhappy that I have had difficulty sleeping. 0  I have felt sad or miserable. 0  I have been so unhappy that I have been crying. 0  The thought of harming myself has occurred to me. 0  Edinburgh Postnatal Depression Scale Total 3      After visit meds:  Allergies as of 12/08/2020       Reactions   Ceclor [cefaclor] Rash   Latex Rash   Sulfa Antibiotics Rash   Childhood reaction        Medication List     TAKE these medications    calcium carbonate 500 MG chewable tablet Commonly known as: TUMS - dosed in mg elemental calcium Chew 1-2 tablets by mouth 3 (three) times daily as needed for indigestion or heartburn.   ibuprofen 800 MG tablet Commonly known as: ADVIL Take 1 tablet (800 mg total)  by mouth every 6 (six) hours as needed for moderate pain.   oxyCODONE 5 MG immediate release tablet Commonly known as: Oxy IR/ROXICODONE Take 1-2 tablets (5-10 mg total) by mouth every 4 (four) hours as needed for up to 7 days for moderate pain.   PRENATAL VITAMIN PO Take 1 tablet by mouth in the morning.               Discharge Care Instructions  (From admission, onward)           Start     Ordered   12/08/20 0000  Discharge wound care:       Comments: Vacuum removal 4/25 office   12/08/20 0951             Discharge home in stable condition Infant Feeding: Breast Infant Disposition:home with mother Discharge instruction: per After Visit Summary and Postpartum booklet. Activity: Advance as tolerated. Pelvic rest for 6 weeks.  Diet: routine diet Anticipated Birth Control: Condoms Postpartum Appointment:1 week Additional Postpartum F/U: 2 hour GTT Future Appointments:No future appointments. Follow up Visit:  Follow-up Information     Servando Salina, MD Follow up on 12/17/2020.   Specialty: Obstetrics and Gynecology Why: vacuum removal/ call for appt Contact information: [address] [place] [address] [phone]                     12/08/2020 Marvene Staff, MD

## 2020-12-08 NOTE — Lactation Note (Signed)
This note was copied from a baby's chart. Lactation Consultation Note  Patient Name: [name] [name] [id number] Date: 12/08/2020 Reason for consult: Follow-up assessment Age:40 hours   P3 mother whose infant is now 74 hours old.  This is an ETI at 38+6 weeks.  Mother has breast feeding experience of one year with each of her other children.  Mother had no questions/concerns related to breast feeding.  Baby had recently fed.  LATCH scores of 7-8 and multiple voids/stools.  Currently there is a 9% weight loss, however, mother informed me that this happened with one of her other children.  Engorgement prevention/treatment reviewed.  Mother has experienced engorgement in the past.  She has a manual pump and a DEBP for home use.  She has an OP LC visit already scheduled for Monday (2 days from now).  Mother has our OP phone number for any concerns after discharge.  No support person here at the present time.   Maternal Data    Feeding    LATCH Score Latch: Repeated attempts needed to sustain latch, nipple held in mouth throughout feeding, stimulation needed to elicit sucking reflex.  Audible Swallowing: A few with stimulation  Type of Nipple: Everted at rest and after stimulation  Comfort (Breast/Nipple): Soft / non-tender  Hold (Positioning): Assistance needed to correctly position infant at breast and maintain latch.  LATCH Score: 7   Lactation Tools Discussed/Used    Interventions Interventions: Education  Discharge Discharge Education: Engorgement and breast care  Consult Status Consult Status: Complete Date: 12/08/20 Follow-up type: Call as needed    Emmalene Kattner R Belmira Daley 12/08/2020, 9:40 AM

## 2020-12-08 NOTE — Progress Notes (Signed)
POD #2 S/p repeat C/S  S: still notes some blurry vision No other complaint Ready to go home   O:  Patient Vitals for the past 24 hrs:  BP Temp Temp src Pulse Resp  12/08/20 0554 106/67 98.6 F (37 C) Oral (!) 59 18  12/07/20 2025 (!) 112/58 98 F (36.7 C) Oral (!) 55 18  Lungs clear to A Cor RRR Abd: uterus firm at umb  Wound vac in place  Pad scant lochia  Extr no edema or calf tenderness  IMP: stable pp S/p POD #2 s/p repeat C/s Class A1 GDM Term gestation P) d/c home D/c instructions reviewed Scripts sent for pain  F/u 4/25 for wound vac removal

## 2020-12-08 NOTE — Discharge Instructions (Signed)
Call if temperature greater than equal to 100.4, nothing per vagina for 4-6 weeks or severe nausea vomiting, increased incisional pain , drainage or redness in the incision site, no straining with bowel movements, showers no bathCall if temperature greater than equal to 100.4, nothing per vagina for 4-6 weeks or severe nausea vomiting, increased incisional pain , drainage or redness in the incision site, no straining with bowel movements, showers no bath 

## 2021-09-14 IMAGING — US US OB < 14 WEEKS - US OB TV
1 series · 14 of 28 positions shown · non-contrast
Comparison: None

CLINICAL DATA: Confirmation of pregnancy, quantitative beta HCG in
excess of [DATE], LMP 03/09/2020

EXAM:
OBSTETRIC <14 WK US AND TRANSVAGINAL OB US
TECHNIQUE: Both transabdominal and transvaginal ultrasound examinations were
performed for complete evaluation of the gestation as well as the
maternal uterus, adnexal regions, and pelvic cul-de-sac.
Transvaginal technique was performed to assess early pregnancy.

[Series 2: us ob < 14 weeks - us ob tv · 0.22mm/px · 14 of 124 slices shown]
[im 5/124]
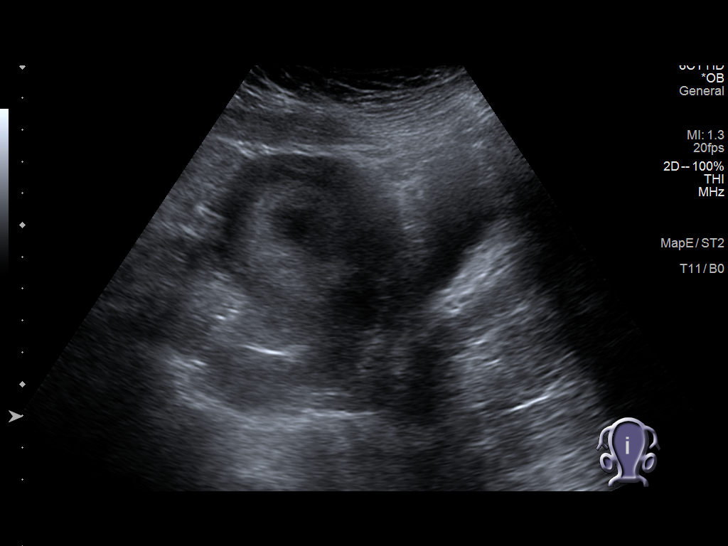
[im 14/124]
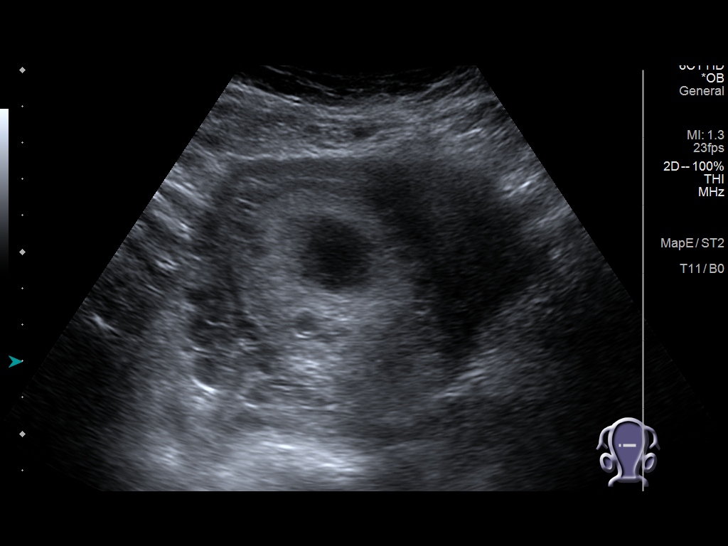
[im 23/124]
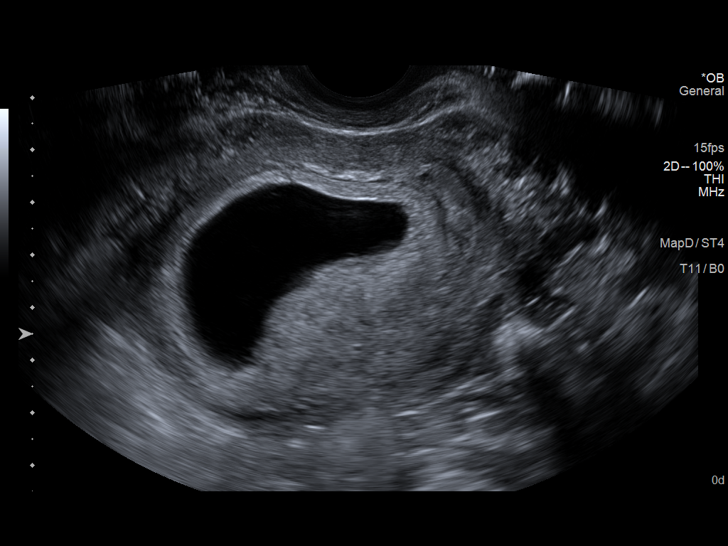
[im 32/124]
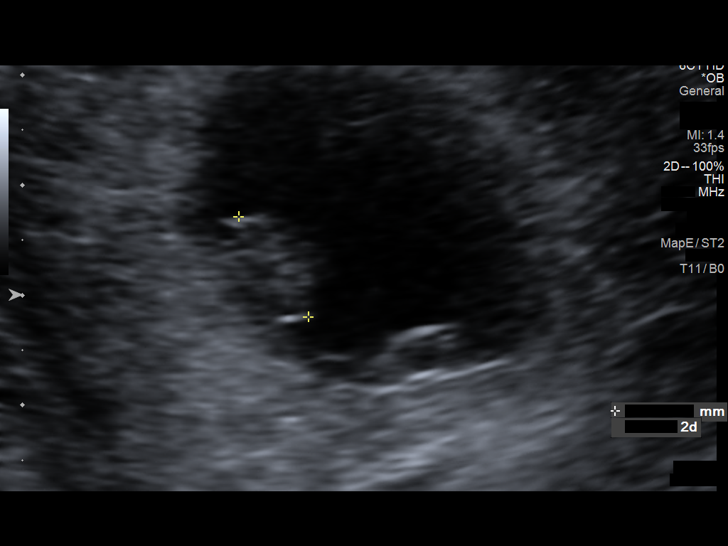
[im 42/124]
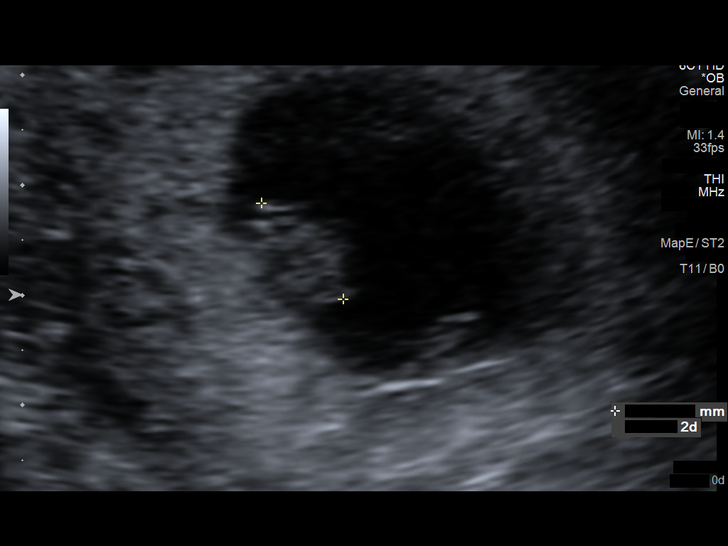
[im 51/124]
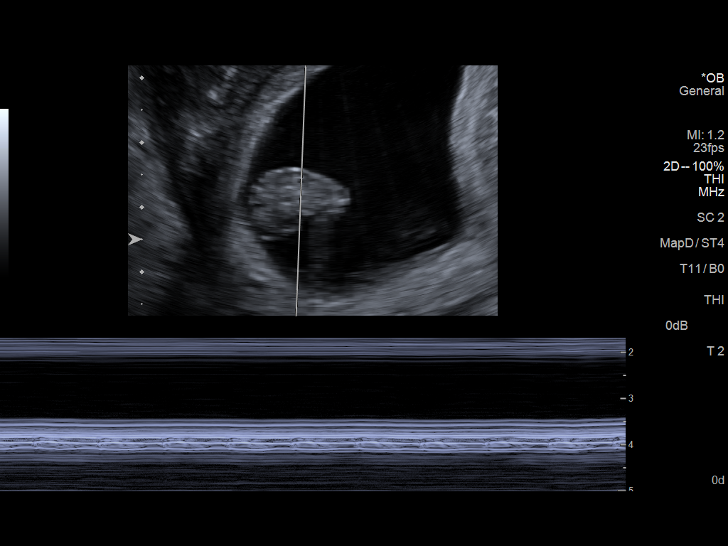
[im 60/124]
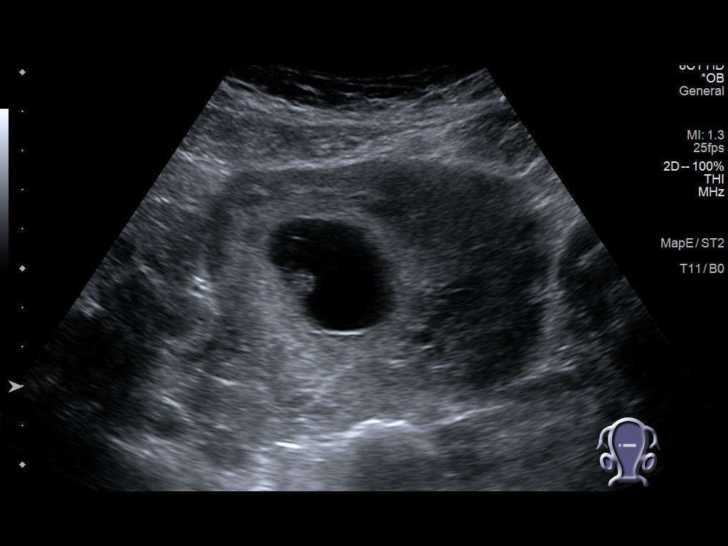
[im 69/124]
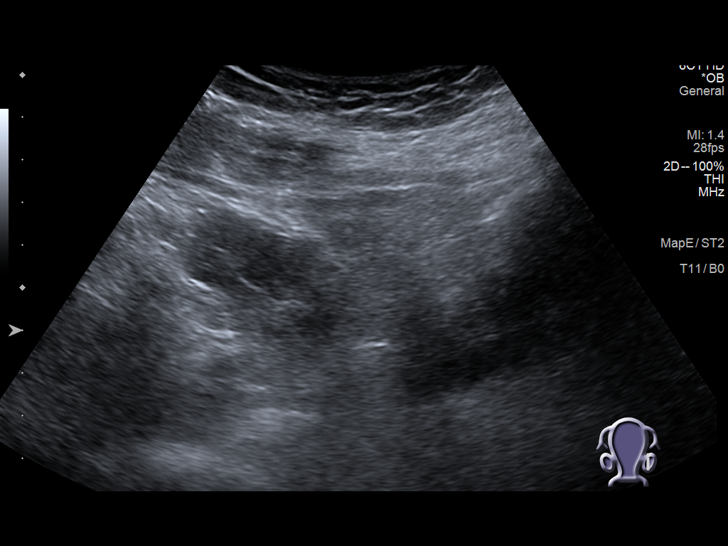
[im 78/124]
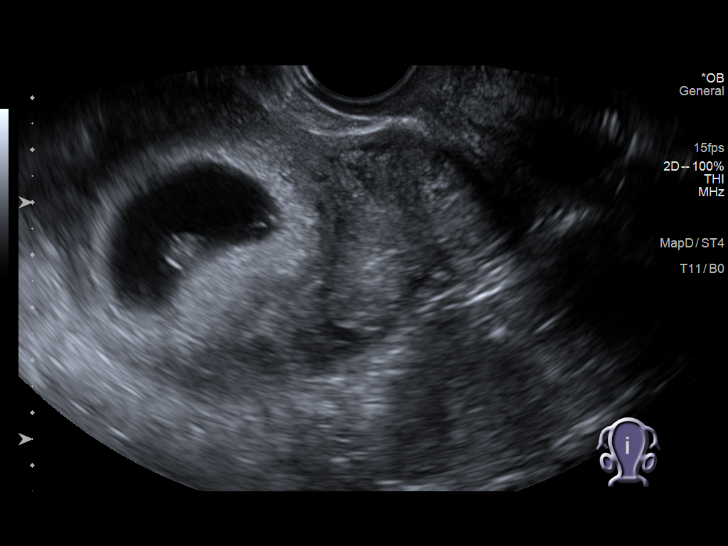
[im 87/124]
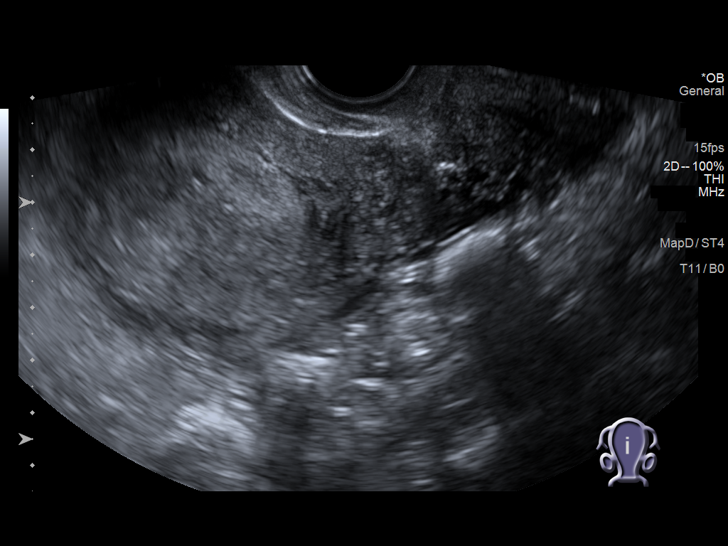
[im 96/124]
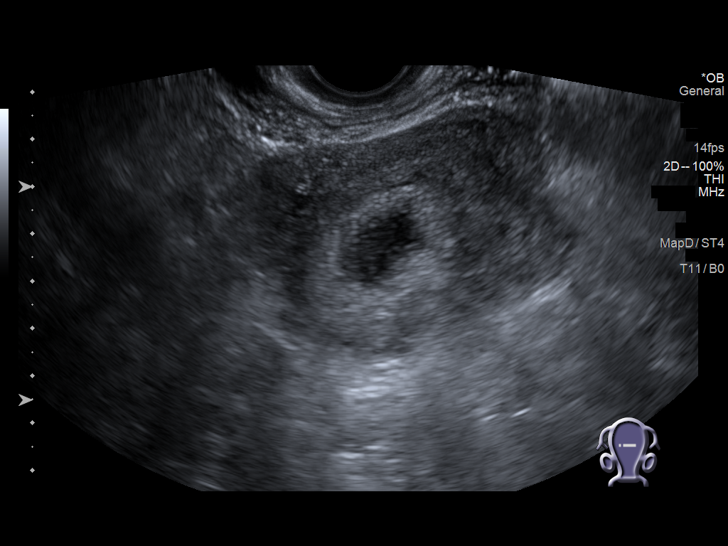
[im 105/124]
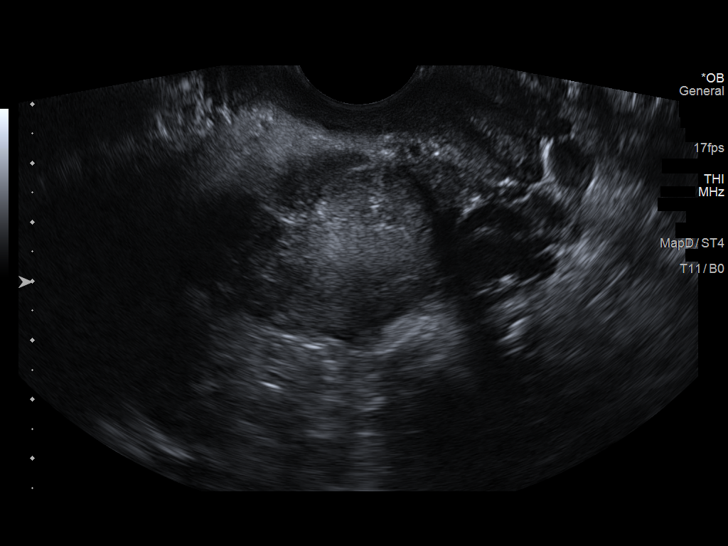
[im 114/124]
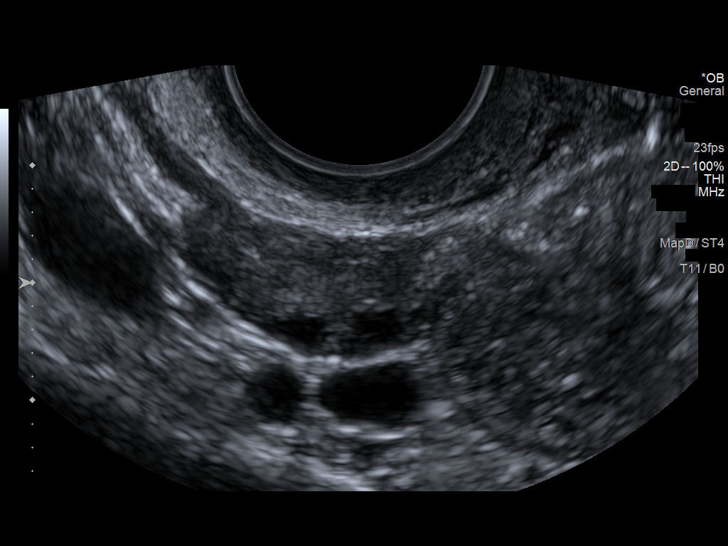
[im 124/124]
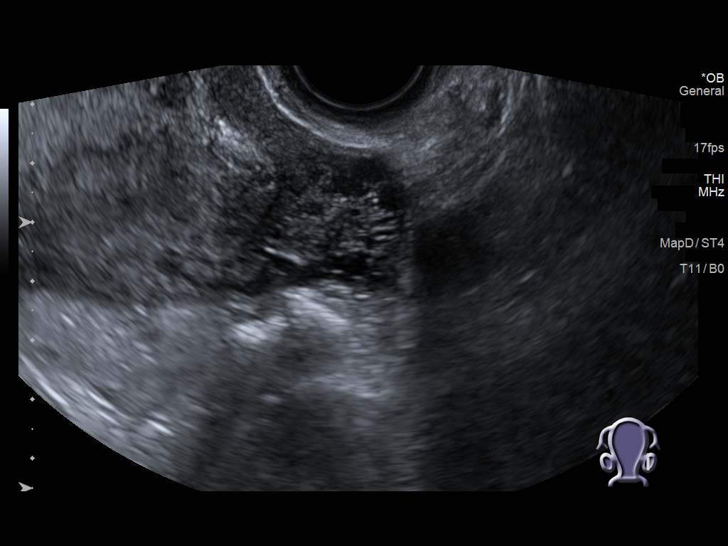

[14 of 28 positions shown; findings below may reference images not displayed]

FINDINGS: Intrauterine gestational sac: Present, single

Yolk sac:  Present

Embryo:  Present

Cardiac Activity: Present

Heart Rate: 160 bpm

CRL:  13.6 mm   7 w   4 d                  US EDC: 12/17/2020

Subchorionic hemorrhage:  None visualized.

Maternal uterus/adnexae:

Maternal uterus anteverted with a Caesarean section scar at the
anterior lower uterine segment.

RIGHT ovary normal size and morphology, 1.9 x 2.3 x 0.9 cm.

LEFT ovary not visualized, likely obscured by bowel gas.

No free pelvic fluid or adnexal masses.
IMPRESSION: Single live intrauterine gestation at 7 weeks 4 days EGA by
crown-rump length.

No acute abnormalities.
